# Patient Record
Sex: Female | Born: 1950
Health system: Southern US, Community
[De-identification: ages and names within clinical notes are randomized; demographics above are authoritative.]

## PROBLEM LIST (undated history)

## (undated) DIAGNOSIS — K219 Gastro-esophageal reflux disease without esophagitis: Secondary | ICD-10-CM

## (undated) DIAGNOSIS — M1990 Unspecified osteoarthritis, unspecified site: Secondary | ICD-10-CM

## (undated) DIAGNOSIS — K5730 Diverticulosis of large intestine without perforation or abscess without bleeding: Secondary | ICD-10-CM

## (undated) DIAGNOSIS — Z8709 Personal history of other diseases of the respiratory system: Secondary | ICD-10-CM

## (undated) DIAGNOSIS — N189 Chronic kidney disease, unspecified: Secondary | ICD-10-CM

## (undated) DIAGNOSIS — M6530 Trigger finger, unspecified finger: Secondary | ICD-10-CM

## (undated) DIAGNOSIS — I10 Essential (primary) hypertension: Secondary | ICD-10-CM

## (undated) DIAGNOSIS — M810 Age-related osteoporosis without current pathological fracture: Secondary | ICD-10-CM

## (undated) DIAGNOSIS — F419 Anxiety disorder, unspecified: Secondary | ICD-10-CM

## (undated) DIAGNOSIS — E7800 Pure hypercholesterolemia, unspecified: Secondary | ICD-10-CM

## (undated) DIAGNOSIS — R718 Other abnormality of red blood cells: Secondary | ICD-10-CM

## (undated) DIAGNOSIS — I872 Venous insufficiency (chronic) (peripheral): Secondary | ICD-10-CM

## (undated) HISTORY — PX: POLYPECTOMY: SHX149

## (undated) HISTORY — DX: Venous insufficiency (chronic) (peripheral): I87.2

## (undated) HISTORY — DX: Other abnormality of red blood cells: R71.8

## (undated) HISTORY — PX: MULTIPLE TOOTH EXTRACTIONS: SHX2053

## (undated) HISTORY — PX: DENTAL SURGERY: SHX609

## (undated) HISTORY — PX: COLONOSCOPY: SHX174

## (undated) HISTORY — DX: Anxiety disorder, unspecified: F41.9

## (undated) HISTORY — DX: Chronic kidney disease, unspecified: N18.9

## (undated) HISTORY — DX: Personal history of other diseases of the respiratory system: Z87.09

## (undated) HISTORY — DX: Pure hypercholesterolemia, unspecified: E78.00

## (undated) HISTORY — DX: Unspecified osteoarthritis, unspecified site: M19.90

## (undated) HISTORY — DX: Age-related osteoporosis without current pathological fracture: M81.0

## (undated) HISTORY — PX: TUBAL LIGATION: SHX77

## (undated) HISTORY — DX: Diverticulosis of large intestine without perforation or abscess without bleeding: K57.30

## (undated) HISTORY — DX: Essential (primary) hypertension: I10

## (undated) HISTORY — DX: Gastro-esophageal reflux disease without esophagitis: K21.9

---

## 1989-09-25 HISTORY — PX: FOOT SURGERY: SHX648

## 1998-01-07 ENCOUNTER — Ambulatory Visit (HOSPITAL_COMMUNITY): Admission: RE | Admit: 1998-01-07 | Discharge: 1998-01-07 | Payer: Self-pay | Admitting: Pulmonary Disease

## 1998-07-13 ENCOUNTER — Ambulatory Visit (HOSPITAL_COMMUNITY): Admission: RE | Admit: 1998-07-13 | Discharge: 1998-07-13 | Payer: Self-pay | Admitting: Pulmonary Disease

## 1999-07-21 ENCOUNTER — Encounter: Payer: Self-pay | Admitting: Pulmonary Disease

## 1999-07-21 ENCOUNTER — Ambulatory Visit (HOSPITAL_COMMUNITY): Admission: RE | Admit: 1999-07-21 | Discharge: 1999-07-21 | Payer: Self-pay | Admitting: Pulmonary Disease

## 1999-12-28 ENCOUNTER — Other Ambulatory Visit: Admission: RE | Admit: 1999-12-28 | Discharge: 1999-12-28 | Payer: Self-pay | Admitting: *Deleted

## 2000-07-19 ENCOUNTER — Ambulatory Visit (HOSPITAL_COMMUNITY): Admission: RE | Admit: 2000-07-19 | Discharge: 2000-07-19 | Payer: Self-pay | Admitting: *Deleted

## 2000-07-19 ENCOUNTER — Encounter: Payer: Self-pay | Admitting: *Deleted

## 2001-04-30 ENCOUNTER — Other Ambulatory Visit: Admission: RE | Admit: 2001-04-30 | Discharge: 2001-04-30 | Payer: Self-pay | Admitting: *Deleted

## 2001-08-05 ENCOUNTER — Encounter: Payer: Self-pay | Admitting: *Deleted

## 2001-08-05 ENCOUNTER — Ambulatory Visit (HOSPITAL_COMMUNITY): Admission: RE | Admit: 2001-08-05 | Discharge: 2001-08-05 | Payer: Self-pay | Admitting: *Deleted

## 2002-08-14 ENCOUNTER — Encounter: Payer: Self-pay | Admitting: *Deleted

## 2002-08-14 ENCOUNTER — Ambulatory Visit (HOSPITAL_COMMUNITY): Admission: RE | Admit: 2002-08-14 | Discharge: 2002-08-14 | Payer: Self-pay | Admitting: *Deleted

## 2003-03-09 ENCOUNTER — Other Ambulatory Visit: Admission: RE | Admit: 2003-03-09 | Discharge: 2003-03-09 | Payer: Self-pay | Admitting: *Deleted

## 2003-09-01 ENCOUNTER — Ambulatory Visit (HOSPITAL_COMMUNITY): Admission: RE | Admit: 2003-09-01 | Discharge: 2003-09-01 | Payer: Self-pay | Admitting: *Deleted

## 2004-03-22 ENCOUNTER — Other Ambulatory Visit: Admission: RE | Admit: 2004-03-22 | Discharge: 2004-03-22 | Payer: Self-pay | Admitting: *Deleted

## 2004-09-21 ENCOUNTER — Ambulatory Visit (HOSPITAL_COMMUNITY): Admission: RE | Admit: 2004-09-21 | Discharge: 2004-09-21 | Payer: Self-pay | Admitting: *Deleted

## 2005-04-25 ENCOUNTER — Ambulatory Visit: Payer: Self-pay | Admitting: Pulmonary Disease

## 2005-04-26 ENCOUNTER — Other Ambulatory Visit: Admission: RE | Admit: 2005-04-26 | Discharge: 2005-04-26 | Payer: Self-pay | Admitting: *Deleted

## 2005-05-10 ENCOUNTER — Ambulatory Visit: Payer: Self-pay | Admitting: Pulmonary Disease

## 2005-10-12 ENCOUNTER — Ambulatory Visit (HOSPITAL_COMMUNITY): Admission: RE | Admit: 2005-10-12 | Discharge: 2005-10-12 | Payer: Self-pay | Admitting: *Deleted

## 2006-05-14 ENCOUNTER — Other Ambulatory Visit: Admission: RE | Admit: 2006-05-14 | Discharge: 2006-05-14 | Payer: Self-pay | Admitting: *Deleted

## 2006-06-19 ENCOUNTER — Ambulatory Visit: Payer: Self-pay | Admitting: Pulmonary Disease

## 2006-06-26 ENCOUNTER — Ambulatory Visit: Payer: Self-pay | Admitting: Pulmonary Disease

## 2006-07-11 ENCOUNTER — Ambulatory Visit: Payer: Self-pay | Admitting: Internal Medicine

## 2006-07-31 ENCOUNTER — Ambulatory Visit: Payer: Self-pay | Admitting: Internal Medicine

## 2006-11-14 ENCOUNTER — Ambulatory Visit (HOSPITAL_COMMUNITY): Admission: RE | Admit: 2006-11-14 | Discharge: 2006-11-14 | Payer: Self-pay | Admitting: Neurology

## 2007-08-08 ENCOUNTER — Ambulatory Visit: Payer: Self-pay | Admitting: Pulmonary Disease

## 2007-08-20 ENCOUNTER — Telehealth (INDEPENDENT_AMBULATORY_CARE_PROVIDER_SITE_OTHER): Payer: Self-pay | Admitting: *Deleted

## 2007-08-21 ENCOUNTER — Ambulatory Visit: Payer: Self-pay | Admitting: Pulmonary Disease

## 2007-08-21 LAB — CONVERTED CEMR LAB
BUN: 17 mg/dL (ref 6–23)
Basophils Relative: 0.7 % (ref 0.0–1.0)
Bilirubin, Direct: 0.1 mg/dL (ref 0.0–0.3)
Calcium: 9.6 mg/dL (ref 8.4–10.5)
Chloride: 104 meq/L (ref 96–112)
Creatinine, Ser: 0.7 mg/dL (ref 0.4–1.2)
Eosinophils Absolute: 0 10*3/uL (ref 0.0–0.6)
Eosinophils Relative: 1.4 % (ref 0.0–5.0)
Glucose, Bld: 85 mg/dL (ref 70–99)
HDL: 49.1 mg/dL (ref >39.0)
Hemoglobin: 13.4 g/dL (ref 12.0–15.0)
LDL Cholesterol: 84 mg/dL (ref 0–99)
MCV: 81.3 fL (ref 78.0–100.0)
Monocytes Relative: 11.1 % — ABNORMAL HIGH (ref 3.0–11.0)
Neutro Abs: 1.4 10*3/uL (ref 1.4–7.7)
Neutrophils Relative %: 48.7 % (ref 43.0–77.0)
RDW: 12.7 % (ref 11.5–14.6)
TSH: 0.81 microintl units/mL (ref 0.35–5.50)
VLDL: 6 mg/dL (ref 0–40)

## 2007-08-26 ENCOUNTER — Encounter: Payer: Self-pay | Admitting: Pulmonary Disease

## 2007-08-26 DIAGNOSIS — I10 Essential (primary) hypertension: Secondary | ICD-10-CM

## 2007-08-26 DIAGNOSIS — E7800 Pure hypercholesterolemia, unspecified: Secondary | ICD-10-CM

## 2007-08-26 DIAGNOSIS — F411 Generalized anxiety disorder: Secondary | ICD-10-CM | POA: Insufficient documentation

## 2007-11-18 ENCOUNTER — Ambulatory Visit (HOSPITAL_COMMUNITY): Admission: RE | Admit: 2007-11-18 | Discharge: 2007-11-18 | Payer: Self-pay | Admitting: *Deleted

## 2008-08-28 ENCOUNTER — Ambulatory Visit: Payer: Self-pay | Admitting: Pulmonary Disease

## 2008-08-28 ENCOUNTER — Telehealth (INDEPENDENT_AMBULATORY_CARE_PROVIDER_SITE_OTHER): Payer: Self-pay | Admitting: *Deleted

## 2008-08-28 DIAGNOSIS — R718 Other abnormality of red blood cells: Secondary | ICD-10-CM | POA: Insufficient documentation

## 2008-08-28 DIAGNOSIS — K5730 Diverticulosis of large intestine without perforation or abscess without bleeding: Secondary | ICD-10-CM | POA: Insufficient documentation

## 2008-08-28 DIAGNOSIS — I872 Venous insufficiency (chronic) (peripheral): Secondary | ICD-10-CM

## 2008-08-28 DIAGNOSIS — J40 Bronchitis, not specified as acute or chronic: Secondary | ICD-10-CM | POA: Insufficient documentation

## 2008-09-04 ENCOUNTER — Ambulatory Visit: Payer: Self-pay | Admitting: Pulmonary Disease

## 2008-09-04 DIAGNOSIS — E039 Hypothyroidism, unspecified: Secondary | ICD-10-CM | POA: Insufficient documentation

## 2008-09-04 DIAGNOSIS — R748 Abnormal levels of other serum enzymes: Secondary | ICD-10-CM | POA: Insufficient documentation

## 2008-09-04 DIAGNOSIS — D649 Anemia, unspecified: Secondary | ICD-10-CM | POA: Insufficient documentation

## 2008-09-06 LAB — CONVERTED CEMR LAB
Albumin: 3.7 g/dL (ref 3.5–5.2)
Alkaline Phosphatase: 103 units/L (ref 39–117)
BUN: 17 mg/dL (ref 6–23)
Basophils Absolute: 0 10*3/uL (ref 0.0–0.1)
Chloride: 104 meq/L (ref 96–112)
Cholesterol: 187 mg/dL (ref 0–200)
GFR calc non Af Amer: 92 mL/min
Glucose, Bld: 89 mg/dL (ref 70–99)
HDL: 49.3 mg/dL (ref >39.0)
Hemoglobin: 13 g/dL (ref 12.0–15.0)
MCHC: 34 g/dL (ref 30.0–36.0)
MCV: 81.2 fL (ref 78.0–100.0)
Monocytes Absolute: 0.4 10*3/uL (ref 0.1–1.0)
Neutro Abs: 1.9 10*3/uL (ref 1.4–7.7)
Neutrophils Relative %: 56.1 % (ref 43.0–77.0)
Platelets: 155 10*3/uL (ref 150–400)
Potassium: 4.3 meq/L (ref 3.5–5.1)
RBC: 4.69 M/uL (ref 3.87–5.11)
Sodium: 141 meq/L (ref 135–145)
TSH: 0.54 microintl units/mL (ref 0.35–5.50)
VLDL: 8 mg/dL (ref 0–40)
WBC: 3.3 10*3/uL — ABNORMAL LOW (ref 4.5–10.5)

## 2008-11-20 ENCOUNTER — Ambulatory Visit (HOSPITAL_COMMUNITY): Admission: RE | Admit: 2008-11-20 | Discharge: 2008-11-20 | Payer: Self-pay | Admitting: Pulmonary Disease

## 2009-11-08 ENCOUNTER — Ambulatory Visit: Payer: Self-pay | Admitting: Pulmonary Disease

## 2009-11-09 ENCOUNTER — Ambulatory Visit: Payer: Self-pay | Admitting: Pulmonary Disease

## 2009-11-11 LAB — CONVERTED CEMR LAB
AST: 26 units/L (ref 0–37)
Albumin: 4.1 g/dL (ref 3.5–5.2)
Basophils Absolute: 0 10*3/uL (ref 0.0–0.1)
Basophils Relative: 0.4 % (ref 0.0–3.0)
Bilirubin Urine: NEGATIVE
CO2: 30 meq/L (ref 19–32)
Calcium: 9.7 mg/dL (ref 8.4–10.5)
Creatinine, Ser: 0.7 mg/dL (ref 0.4–1.2)
GFR calc non Af Amer: 110.62 mL/min (ref >60)
Glucose, Bld: 83 mg/dL (ref 70–99)
HCT: 39.5 % (ref 36.0–46.0)
Hemoglobin, Urine: NEGATIVE
Hemoglobin: 12.7 g/dL (ref 12.0–15.0)
Iron: 68 ug/dL (ref 42–145)
Leukocytes, UA: NEGATIVE
Lymphocytes Relative: 28.4 % (ref 12.0–46.0)
MCHC: 32.3 g/dL (ref 30.0–36.0)
MCV: 82.6 fL (ref 78.0–100.0)
Neutro Abs: 2.1 10*3/uL (ref 1.4–7.7)
Platelets: 172 10*3/uL (ref 150.0–400.0)
Potassium: 4.3 meq/L (ref 3.5–5.1)
Sodium: 140 meq/L (ref 135–145)
Specific Gravity, Urine: 1.01 (ref 1.000–1.030)
Total Bilirubin: 0.7 mg/dL (ref 0.3–1.2)
Total Protein, Urine: NEGATIVE mg/dL
Transferrin: 254.5 mg/dL (ref 212.0–360.0)
VLDL: 9.2 mg/dL (ref 0.0–40.0)
WBC: 3.7 10*3/uL — ABNORMAL LOW (ref 4.5–10.5)

## 2009-11-24 ENCOUNTER — Ambulatory Visit (HOSPITAL_COMMUNITY): Admission: RE | Admit: 2009-11-24 | Discharge: 2009-11-24 | Payer: Self-pay | Admitting: Pulmonary Disease

## 2010-07-12 ENCOUNTER — Ambulatory Visit: Payer: Self-pay | Admitting: Pulmonary Disease

## 2010-07-12 DIAGNOSIS — J069 Acute upper respiratory infection, unspecified: Secondary | ICD-10-CM | POA: Insufficient documentation

## 2010-10-25 NOTE — Assessment & Plan Note (Signed)
Summary: SOB/ COUGHING /CONGESTED/ MBW   Visit Type:  Acute NP visit Primary Provider/Referring Provider:  Alroy Dust, MD  CC:  Pt c/o  productive cough at night with small amount of thick light yellow mucus, non-productive cough during the day, head and chest congestion, sneezing, increased SOB during the day with exertion starting Friday. sore throat x 3 days now gone. Pt denies headaches, fever, chills, and n/v/d.  History of Present Illness: 60 yo female with known hx of HTN and Hyperlipidemia.    July 12, 2010--Presents for an acute office visit. Complains of  productive cough at night with small amount of thick light yellow mucus, non-productive cough during the day, head and chest congestion, sneezing,  increased SOB during the day with exertion starting Friday. sore throat x 3 days now gone. Pt denies headaches, fever, chills, n/v/d. Denies chest pain, dyspnea, orthopnea, hemoptysis, fever, n/v/d, edema, headache,recent travel or antibiotics  Preventive Screening-Counseling & Management  Alcohol-Tobacco     Smoking Status: never  Current Medications (verified): 1)  Amlodipine Besy-Benazepril Hcl 10-20 Mg Caps (Amlodipine Besy-Benazepril Hcl) .... Take 1 Tablet By Mouth Once A Day 2)  Triamterene-Hctz 37.5-25 Mg Tabs (Triamterene-Hctz) .... Take 1 Tablet By Mouth Once A Day 3)  Simvastatin 20 Mg Tabs (Simvastatin) .... Take 1 Tablet By Mouth Once A Day 4)  Tylenol Nighttime Cold & Flu 60-12.02-21-999 Mg/5ml Liqd (Pseudoeph-Doxylamine-Dm-Apap) .... As Directed Per Bottle  Allergies (verified): No Known Drug Allergies  Past History:  Past Medical History: Last updated: 11/08/2009  Hx of BRONCHITIS (ICD-490) HYPERTENSION (ICD-401.9) VENOUS INSUFFICIENCY (ICD-459.81) HYPERCHOLESTEROLEMIA (ICD-272.0) DIVERTICULOSIS OF COLON (ICD-562.10) ANXIETY (ICD-300.00) Hx of MICROCYTOSIS (ICD-790.09)  Past Surgical History: Last updated: 11/08/2009 S/P foot surgery in  1991  Family History: Last updated: 09-14-08 Father died ?age ?DM- didn't know him... Mother alive age 77 w/ HBP and arthritis... 4 Siblings:  1 Sis w/ HBP & arthritis; 3 sibs- ?don't know them...  Social History: Last updated: 14-Sep-2008 Divorced 2 Children Never smoked Social Etoh Not exercising  Risk Factors: Smoking Status: never (07/12/2010)  Review of Systems      See HPI  Vital Signs:  Patient profile:   60 year old female Height:      63.5 inches Weight:      165.8 pounds BMI:     29.01 BP sitting:   138 / 90  (left arm) Cuff size:   regular  Vitals Entered By: Zackery Barefoot CMA (July 12, 2010 2:53 PM)  O2 Flow:  Room air CC: Pt c/o  productive cough at night with small amount of thick light yellow mucus, non-productive cough during the day, head and chest congestion, sneezing,  increased SOB during the day with exertion starting Friday. sore throat x 3 days now gone. Pt denies headaches, fever, chills, n/v/d Comments Medications reviewed with patient Verified contact number and pharmacy with patient Zackery Barefoot CMA  July 12, 2010 2:54 PM    Physical Exam  Additional Exam:  GEN: A/Ox3; pleasant , NAD HEENT:  [place]/AT, , EACs-clear, TMs-wnl, NOSE-clear drainage , THROAT-clear NECK:  Supple w/ fair ROM; no JVD; normal carotid impulses w/o bruits; no thyromegaly or nodules palpated; no lymphadenopathy. RESP  Clear to P & A; w/o, wheezes/ rales/ or rhonchi. CARD:  RRR, no m/r/g   GI:   Soft & nt; nml bowel sounds; no organomegaly or masses detected. [place]co: Warm bil,  no calf tenderness edema, clubbing, pulses intact Neuro:intact w/ no focal deficits noted.    Impression &  Recommendations:  Problem # 1:  UPPER RESPIRATORY INFECTION, ACUTE (ICD-465.9)  Mucinex DM two times a day as needed cough/congestion Saline nasal rinses as needed  Zyrtec 10mg  at bedtime as needed drainage.  Increased fluid and rest  Tylenol as needed  Hydromet 1-2 tsp  every 4-6 hr as needed cough , may make you sleepy.  Zpack to have on hold if symptoms worsen with discolored mucus.  Please contact office for sooner follow up if symptoms do not improve or worsen The following medications were removed from the medication list:    Aspirin 81 Mg Tbec (Aspirin) .Marland Kitchen... Take 1 tab by mouth once daily... Her updated medication list for this problem includes:    Tylenol Nighttime Cold & Flu 60-12.02-21-999 Mg/58ml Liqd (Pseudoeph-doxylamine-dm-apap) .Marland Kitchen... As directed per bottle    Hydromet 5-1.5 Mg/92ml Syrp (Hydrocodone-homatropine) .Marland Kitchen... 1-2 tsp every 4-6 hr as needed cough  Orders: Est. Patient Level III (25956)  Medications Added to Medication List This Visit: 1)  Tylenol Nighttime Cold & Flu 60-12.02-21-999 Mg/75ml Liqd (Pseudoeph-doxylamine-dm-apap) .... As directed per bottle 2)  Hydromet 5-1.5 Mg/43ml Syrp (Hydrocodone-homatropine) .Marland Kitchen.. 1-2 tsp every 4-6 hr as needed cough 3)  Zithromax Tri-pak 500 Mg Tabs (Azithromycin) .... Take as directed.  Complete Medication List: 1)  Amlodipine Besy-benazepril Hcl 10-20 Mg Caps (Amlodipine besy-benazepril hcl) .... Take 1 tablet by mouth once a day 2)  Triamterene-hctz 37.5-25 Mg Tabs (Triamterene-hctz) .... Take 1 tablet by mouth once a day 3)  Simvastatin 20 Mg Tabs (Simvastatin) .... Take 1 tablet by mouth once a day 4)  Tylenol Nighttime Cold & Flu 60-12.02-21-999 Mg/23ml Liqd (Pseudoeph-doxylamine-dm-apap) .... As directed per bottle 5)  Hydromet 5-1.5 Mg/19ml Syrp (Hydrocodone-homatropine) .Marland Kitchen.. 1-2 tsp every 4-6 hr as needed cough 6)  Zithromax Tri-pak 500 Mg Tabs (Azithromycin) .... Take as directed.  Patient Instructions: 1)  Mucinex DM two times a day as needed cough/congestion 2)  Saline nasal rinses as needed  3)  Zyrtec 10mg  at bedtime as needed drainage.  4)  Increased fluid and rest  5)  Tylenol as needed  6)  Hydromet 1-2 tsp every 4-6 hr as needed cough , may make you sleepy.  7)  Zpack to have  on hold if symptoms worsen with discolored mucus.  8)  Please contact office for sooner follow up if symptoms do not improve or worsen Prescriptions: ZITHROMAX TRI-PAK 500 MG TABS (AZITHROMYCIN) take as directed.  #1 x 0   Entered and Authorized by:   Rubye Oaks NP   Signed by:   Ravinder Lukehart NP on 07/12/2010   Method used:   Print then Give to Patient   RxID:   [id number] HYDROMET 5-1.5 MG/5ML SYRP (HYDROCODONE-HOMATROPINE) 1-2 tsp every 4-6 hr as needed cough  #8 oz x 0   Entered and Authorized by:   Rubye Oaks NP   Signed by:   Oskar Cretella NP on 07/12/2010   Method used:   Print then Give to Patient   RxID:   [id number]

## 2010-10-25 NOTE — Assessment & Plan Note (Signed)
Summary: physical ///kp   CC:  14 month ROV & CPX....  History of Present Illness: 60 y/o BF here for a follow up visit... she has had a good year and has no new complaints or concerns today... she hasn't been dieting or trying to exercise, but she promises to start...     Current Problems:   Hx of BRONCHITIS (ICD-490) - she is a never smoker... hx bronchitic infections in the past- no recent symptoms... ??remote hx of Sarcoidosis in the 1970's- ?details, and no residual problems...  ~  baseline CXR clear, NAD.Marland Kitchen.  ~  CXR 2/11 showed min tort Ao, clear lungs, WNL...  HYPERTENSION (ICD-401.9) - on LOTREL 10-20 daily + MAXZIDE-25 daily... BP = 130/88 today, feels well, checks BP occas at school- "all good"...  denies HA, fatigue, visual changes, CP, palipit, dizziness, syncope, dyspnea, edema, etc...   VENOUS INSUFFICIENCY (ICD-459.81) - she follows a low sodium diet, elevates legs, & wears support hose Prn...  HYPERCHOLESTEROLEMIA (ICD-272.0) - on SIMVASTATIN 20mg /d w/ good response...  ~  prev FLP's showed TChol 193-223, TG 46-68, HDL 48-64, LDL 126-152... all these on diet alone.  ~  FLP 10/07 on diet alone showed TChol 203, TG 42, HDL 55, LDL 132... rec- start Simva20.  ~  FLP 11/08 on Simva20 showed TChol 139, TG 31, HDL 49, LDL 84... rec- continue same.  ~  FLP 12/09 ?on Simva20 showed TChol 187, TG 40, HDL 49, LDL 130... rec> take Simva20 daily.  ~  FLP 2/11 on Simva20 showed TChol 148, TG 46, HDL 61, LDL 77... rec> keep same.  DIVERTICULOSIS OF COLON (ICD-562.10) - colonoscopy 11/07 by DrPerry showed divertics only... f/u planned 10 yrs...  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  Hx of MICROCYTOSIS (ICD-790.09) - her CBC's x yrs have shown normal Hg, but small cells w/ MCV in the 79-80 range--- ? prev iron studies? she is post menopausal, all stool checks have been neg...  ~  labs 12/09 showed Hg= 13.0, MCV= 81  ~  labs 2/11 showed Hg= 12.7,  MCV= 83, Fe= 68   Comments:  Nurse/Medical Assistant: The patient's medications and allergies were reviewed with the patient and were updated in the Medication and Allergy Lists.  Past History:  Past Medical History:  Hx of BRONCHITIS (ICD-490) HYPERTENSION (ICD-401.9) VENOUS INSUFFICIENCY (ICD-459.81) HYPERCHOLESTEROLEMIA (ICD-272.0) DIVERTICULOSIS OF COLON (ICD-562.10) ANXIETY (ICD-300.00) Hx of MICROCYTOSIS (ICD-790.09)  Past Surgical History: S/P foot surgery in 1991  Family History: Reviewed history from 08/28/2008 and no changes required. Father died ?age ?DM- didn't know him... Mother alive age 43 w/ HBP and arthritis... 4 Siblings:  1 Sis w/ HBP & arthritis; 3 sibs- ?don't know them...  Social History: Reviewed history from 08/28/2008 and no changes required. Divorced 2 Children Never smoked Social Etoh Not exercising  Review of Systems       The patient complains of gas/bloating and stiffness.  The patient denies fever, chills, sweats, anorexia, fatigue, weakness, malaise, weight loss, sleep disorder, blurring, diplopia, eye irritation, eye discharge, vision loss, eye pain, photophobia, earache, ear discharge, tinnitus, decreased hearing, nasal congestion, nosebleeds, sore throat, hoarseness, chest pain, palpitations, syncope, dyspnea on exertion, orthopnea, PND, peripheral edema, cough, dyspnea at rest, excessive sputum, hemoptysis, wheezing, pleurisy, nausea, vomiting, diarrhea, constipation, change in bowel habits, abdominal pain, melena, hematochezia, jaundice, indigestion/heartburn, dysphagia, odynophagia, dysuria, hematuria, urinary frequency, urinary hesitancy, nocturia, incontinence, back pain, joint pain, joint swelling, muscle cramps, muscle weakness, arthritis, sciatica, restless legs, leg pain  at night, leg pain with exertion, rash, itching, dryness, suspicious lesions, paralysis, paresthesias, seizures, tremors, vertigo, transient blindness, frequent  falls, frequent headaches, difficulty walking, depression, anxiety, memory loss, confusion, cold intolerance, heat intolerance, polydipsia, polyphagia, polyuria, unusual weight change, abnormal bruising, bleeding, enlarged lymph nodes, urticaria, allergic rash, hay fever, and recurrent infections.    Vital Signs:  Patient profile:   60 year old female Height:      63.5 inches Weight:      170.38 pounds BMI:     29.82 O2 Sat:      99 % on Room air Temp:     99.0 degrees F oral Pulse rate:   58 / minute BP sitting:   130 / 88  (right arm) Cuff size:   regular  Vitals Entered By: Amy Haynes CMA (November 08, 2009 11:09 AM)  O2 Sat at Rest %:  99 O2 Flow:  Room air CC: 14 month ROV & CPX... Is Patient Diabetic? No Pain Assessment Patient in pain? no      Comments NO CHANGES IN MEDS   Physical Exam  Additional Exam:  WD, WN, 60 y/o BF in NAD... GENERAL:  Alert & oriented; pleasant & cooperative... HEENT:  Amy Haynes/AT, EOM-wnl, PERRLA, EACs-clear, TMs-wnl, NOSE-clear, THROAT-clear & wnl. NECK:  Supple w/ full ROM; no JVD; normal carotid impulses w/o bruits; no thyromegaly or nodules palpated; no lymphadenopathy. CHEST:  Clear to P & A; without wheezes/ rales/ or rhonchi. HEART:  Regular Rhythm; without murmurs/ rubs/ or gallops. ABDOMEN:  Soft & nontender; normal bowel sounds; no organomegaly or masses detected. EXT: without deformities, mild arthritic changes; no varicose veins/ +venous insuffic/ no edema. NEURO:  CN's intact; motor testing normal; sensory testing normal; gait normal & balance OK. DERM:  No lesions noted; no rash etc...    CXR  Procedure date:  11/08/2009  Findings:      CHEST - 2 VIEW Comparison: None.   Findings: Midline trachea.  Normal heart size.  Mildly tortuous thoracic aorta. No pleural effusion or pneumothorax.  Clear lungs.   IMPRESSION: No acute cardiopulmonary disease.   Read By:  Amy Haynes,  M.D.   EKG  Procedure date:   11/08/2009  Findings:      Normal sinus rhythm with rate of:  60/min Tracing is WNL, NAD...  SN   MISC. Report  Procedure date:  11/09/2009  Findings:      Lipid Panel (LIPID)   Cholesterol               148 mg/dL                   1-610   Triglycerides             46.0 mg/dL                  9.6-045.4   HDL                       09.81 mg/dL                 >19.14   LDL Cholesterol           77 mg/dL                    7-82  BMP (METABOL)   Sodium                    140 mEq/L  135-145   Potassium                 4.3 mEq/L                   3.5-5.1   Chloride                  104 mEq/L                   96-112   Carbon Dioxide            30 mEq/L                    19-32   Glucose                   83 mg/dL                    16-10   BUN                       13 mg/dL                    9-60   Creatinine                0.7 mg/dL                   4.5-4.0   Calcium                   9.7 mg/dL                   9.8-11.9   GFR                       110.62 mL/min               >60  Hepatic/Liver Function Panel (HEPATIC)   Total Bilirubin           0.7 mg/dL                   1.4-7.8   Direct Bilirubin          0.2 mg/dL                   2.9-5.6   Alkaline Phosphatase      78 U/L                      39-117   AST                       26 U/L                      0-37   ALT                       31 U/L                      0-35   Total Protein             7.8 g/dL                    2.1-3.0   Albumin                   4.1 g/dL  3.5-5.2  Comments:      CBC Platelet w/Diff (CBCD)   White Cell Count     [L]  3.7 K/uL                    4.5-10.5   Red Cell Count            4.78 Mil/uL                 3.87-5.11   Hemoglobin                12.7 g/dL                   53.6-64.4   Hematocrit                39.5 %                      36.0-46.0   MCV                       82.6 fl                     78.0-100.0   Platelet Count            172.0 K/uL                   150.0-400.0   Neutrophil %              57.0 %                      43.0-77.0   Lymphocyte %              28.4 %                      12.0-46.0   Monocyte %           [H]  13.5 %                      3.0-12.0   Eosinophils%              0.7 %                       0.0-5.0   Basophils %               0.4 %                       0.0-3.0  Iron (FE)   Iron                      68 ug/dL                    03-474  Transferrin (TRNSF)   Transferrin               254.5 mg/dL                 259.5-638.7  TSH (TSH)   FastTSH                   1.40 uIU/mL                 0.35-5.50  UDip Only (UDIP)   Color  Yellow   Clarity                   CLEAR                       Clear   Specific Gravity          1.010                       1.000 - 1.030   Urine Ph                  6.5                         5.0-8.0   Protein                   NEGATIVE                    Negative   Urine Glucose             NEGATIVE                    Negative   Ketones                   NEGATIVE                    Negative   Urine Bilirubin           NEGATIVE                    Negative   Blood                     NEGATIVE                    Negative  Urobilinogen              0.2                         0.0 - 1.0   Leukocyte Esterace        NEGATIVE                    Negative   Nitrite                   NEGATIVE                    Negative   Impression & Recommendations:  Problem # 1:  PHYSICAL EXAMINATION (ICD-V70.0) She is doing well overall... Orders: EKG w/ Interpretation (93000) T-2 View CXR (71020TC)  Problem # 2:  HYPERTENSION (ICD-401.9) BP controlled on meds... continue same... Her updated medication list for this problem includes:    Amlodipine Besy-benazepril Hcl 10-20 Mg Caps (Amlodipine besy-benazepril hcl) .Marland Kitchen... Take 1 tablet by mouth once a day    Triamterene-hctz 37.5-25 Mg Tabs (Triamterene-hctz) .Marland Kitchen... Take 1 tablet by mouth once a day  Problem # 3:  VENOUS  INSUFFICIENCY (ICD-459.81) Stable-  no edema, reminded no salt!!!  Problem # 4:  HYPERCHOLESTEROLEMIA (ICD-272.0) She appears to be tol the simva20 well... f/u FLP on Rx looks reasonable... advised better diet + exercise!!! Her updated medication list for this problem includes:    Simvastatin 20 Mg Tabs (Simvastatin) .Marland Kitchen... Take 1 tablet by mouth once  a day  Problem # 5:  DIVERTICULOSIS OF COLON (ICD-562.10) She is up to date on colon etc...  Problem # 6:  OTHER MEDICAL PROBLEMS AS NOTED>>> Hg & Fe are OK...  Complete Medication List: 1)  Aspirin 81 Mg Tbec (Aspirin) .... Take 1 tab by mouth once daily.Marland KitchenMarland Kitchen 2)  Amlodipine Besy-benazepril Hcl 10-20 Mg Caps (Amlodipine besy-benazepril hcl) .... Take 1 tablet by mouth once a day 3)  Triamterene-hctz 37.5-25 Mg Tabs (Triamterene-hctz) .... Take 1 tablet by mouth once a day 4)  Simvastatin 20 Mg Tabs (Simvastatin) .... Take 1 tablet by mouth once a day 5)  Caltrate 600+d Plus 600-400 Mg-unit Tabs (Calcium carbonate-vit d-min) .... Take 1 tab by mouth two times a day... 6)  Multiple Vitamin Tabs (Multiple vitamin) .... Take 1 tab by mouth once daily...  Other Orders: Prescription Created Electronically [phone])  Patient Instructions: 1)  Today we updated your med list- see below.... 2)  We refilled your meds for 2011... 3)  Today we did your f/u CXR & EKG... 4)  Please return to our lab in the AM for your FASTING blood work... then call the "phone tree" in a few days for your lab results.Marland KitchenMarland Kitchen 5)  Call for any problems.Marland KitchenMarland Kitchen 6)  Please schedule a follow-up appointment in 1 year. Prescriptions: SIMVASTATIN 20 MG TABS (SIMVASTATIN) Take 1 tablet by mouth once a day  #30 x prn   Entered and Authorized by:   Michele Mcalpine MD   Signed by:   Michele Mcalpine MD on 11/08/2009   Method used:   Print then Give to Patient   RxID:   [id number] TRIAMTERENE-HCTZ 37.5-25 MG TABS (TRIAMTERENE-HCTZ) Take 1 tablet by mouth once a day  #30 x prn   Entered  and Authorized by:   Michele Mcalpine MD   Signed by:   Michele Mcalpine MD on 11/08/2009   Method used:   Print then Give to Patient   RxID:   [id number] AMLODIPINE BESY-BENAZEPRIL HCL 10-20 MG CAPS (AMLODIPINE BESY-BENAZEPRIL HCL) Take 1 tablet by mouth once a day  #30 x prn   Entered and Authorized by:   Michele Mcalpine MD   Signed by:   Michele Mcalpine MD on 11/08/2009   Method used:   Print then Give to Patient   RxID:   [id number]    [facility] ECG  ID: [id number] Patient: Amy Haynes DOB: [birth year] Age: 60 Years Old Sex: Female Race: Black Physician: scott nadel Technician: Amy Haynes CMA Height: 63.5 Weight: 170.38 Status: Unconfirmed Past Medical History:   Hx of BRONCHITIS (ICD-490) HYPERTENSION (ICD-401.9) VENOUS INSUFFICIENCY (ICD-459.81) HYPERCHOLESTEROLEMIA (ICD-272.0) DIVERTICULOSIS OF COLON (ICD-562.10) ANXIETY (ICD-300.00) Hx of MICROCYTOSIS (ICD-790.09)   Recorded: 11/08/2009 11:29 AM P/PR: 133 ms / 197 ms - Heart rate (maximum exercise) QRS: 96 QT/QTc/QTd: 435 ms / 433 ms / 53 ms - Heart rate (maximum exercise)  P/QRS/T axis: 74 deg / 87 deg / 42 deg - Heart rate (maximum exercise)  Heartrate: 59 bpm  Interpretation:  Normal sinus rhythm with rate of:  60/min Tracing is WNL, NAD...  SN

## 2010-11-25 ENCOUNTER — Other Ambulatory Visit: Payer: Self-pay | Admitting: Pulmonary Disease

## 2010-11-25 DIAGNOSIS — Z1231 Encounter for screening mammogram for malignant neoplasm of breast: Secondary | ICD-10-CM

## 2010-11-29 ENCOUNTER — Encounter: Payer: Self-pay | Admitting: Pulmonary Disease

## 2010-11-29 ENCOUNTER — Other Ambulatory Visit: Payer: Self-pay | Admitting: Pulmonary Disease

## 2010-11-29 ENCOUNTER — Ambulatory Visit (INDEPENDENT_AMBULATORY_CARE_PROVIDER_SITE_OTHER): Payer: BC Managed Care – PPO | Admitting: Pulmonary Disease

## 2010-11-29 ENCOUNTER — Ambulatory Visit (INDEPENDENT_AMBULATORY_CARE_PROVIDER_SITE_OTHER)
Admission: RE | Admit: 2010-11-29 | Discharge: 2010-11-29 | Disposition: A | Discharge status: Home / Self Care | Payer: BC Managed Care – PPO | Source: Ambulatory Visit | Attending: Pulmonary Disease | Admitting: Pulmonary Disease

## 2010-11-29 DIAGNOSIS — Z0000 Encounter for general adult medical examination without abnormal findings: Secondary | ICD-10-CM

## 2010-11-30 ENCOUNTER — Ambulatory Visit (HOSPITAL_COMMUNITY)
Admission: RE | Admit: 2010-11-30 | Discharge: 2010-11-30 | Disposition: A | Discharge status: Home / Self Care | Payer: BC Managed Care – PPO | Source: Ambulatory Visit | Attending: Pulmonary Disease | Admitting: Pulmonary Disease

## 2010-11-30 DIAGNOSIS — Z1231 Encounter for screening mammogram for malignant neoplasm of breast: Secondary | ICD-10-CM | POA: Insufficient documentation

## 2010-12-02 ENCOUNTER — Encounter (INDEPENDENT_AMBULATORY_CARE_PROVIDER_SITE_OTHER): Payer: Self-pay | Admitting: *Deleted

## 2010-12-02 ENCOUNTER — Encounter: Payer: Self-pay | Admitting: Pulmonary Disease

## 2010-12-02 ENCOUNTER — Other Ambulatory Visit: Payer: BC Managed Care – PPO

## 2010-12-02 ENCOUNTER — Other Ambulatory Visit: Payer: Self-pay | Admitting: Pulmonary Disease

## 2010-12-02 DIAGNOSIS — E785 Hyperlipidemia, unspecified: Secondary | ICD-10-CM

## 2010-12-02 DIAGNOSIS — Z0000 Encounter for general adult medical examination without abnormal findings: Secondary | ICD-10-CM

## 2010-12-02 LAB — URINALYSIS
Bilirubin Urine: NEGATIVE
Hgb urine dipstick: NEGATIVE
Leukocytes, UA: NEGATIVE
Nitrite: NEGATIVE
Urine Glucose: NEGATIVE
Urobilinogen, UA: 0.2 (ref 0.0–1.0)

## 2010-12-02 LAB — BASIC METABOLIC PANEL
Calcium: 9.6 mg/dL (ref 8.4–10.5)
Creatinine, Ser: 0.7 mg/dL (ref 0.4–1.2)
GFR: 105 mL/min (ref >60.00)
Potassium: 4.3 mEq/L (ref 3.5–5.1)
Sodium: 141 mEq/L (ref 135–145)

## 2010-12-02 LAB — CBC WITH DIFFERENTIAL/PLATELET
Basophils Absolute: 0 10*3/uL (ref 0.0–0.1)
Eosinophils Relative: 1.5 % (ref 0.0–5.0)
Hemoglobin: 13.3 g/dL (ref 12.0–15.0)
Lymphs Abs: 1 10*3/uL (ref 0.7–4.0)
MCHC: 33.1 g/dL (ref 30.0–36.0)
Monocytes Absolute: 0.4 10*3/uL (ref 0.1–1.0)
Monocytes Relative: 13.7 % — ABNORMAL HIGH (ref 3.0–12.0)
Neutro Abs: 1.4 10*3/uL (ref 1.4–7.7)
Platelets: 167 10*3/uL (ref 150.0–400.0)
RBC: 4.9 Mil/uL (ref 3.87–5.11)
RDW: 14.6 % (ref 11.5–14.6)

## 2010-12-02 LAB — LIPID PANEL: VLDL: 8.2 mg/dL (ref 0.0–40.0)

## 2010-12-02 LAB — HEPATIC FUNCTION PANEL
ALT: 23 U/L (ref 0–35)
AST: 22 U/L (ref 0–37)
Albumin: 4.2 g/dL (ref 3.5–5.2)
Alkaline Phosphatase: 70 U/L (ref 39–117)
Bilirubin, Direct: 0.1 mg/dL (ref 0.0–0.3)
Total Bilirubin: 0.6 mg/dL (ref 0.3–1.2)
Total Protein: 7.7 g/dL (ref 6.0–8.3)

## 2010-12-02 LAB — LDL CHOLESTEROL, DIRECT: Direct LDL: 127.4 mg/dL

## 2010-12-20 ENCOUNTER — Telehealth: Payer: Self-pay | Admitting: Pulmonary Disease

## 2010-12-22 NOTE — Telephone Encounter (Signed)
LMTCBx. Carron Curie, CMA

## 2010-12-22 NOTE — Assessment & Plan Note (Signed)
Summary: 1 yr rov ///kp   Primary Care Provider:  Alroy Dust, MD  CC:  Yearly ROV & CPX....  History of Present Illness: 60 y/o BF here for a follow up visit & CPX... Amy Haynes has several issues to discuss>     First Amy Haynes notes sleep issues but thinks they may be stress related;  I offered Amy Haynes a sleep study for further eval & Amy Haynes will think about it & let me know if Amy Haynes wants to proceed...'    Second Amy Haynes voices concern over the simvastatin and memory loss quoting several news reports etc; we discussed the lower incidence of MI & strokes on these meds but Amy Haynes is undeterred & wants off the Simva20- in fact stopped it 2 weeks ago & Amy Haynes FLP off the med showed TChol 205, TG 41, HDL 63, LDL 127;  we reviewed the goals of therapy w/ LDL <100 & Amy Haynes will work on diet (wt down 3# to 167#)...    BP remains controlled on meds & BP = 124/84 here today, similar at home & tol Rx well...    Review of CPX> CXR, EKG, Labs shows Vit D level= 15, and we rec starting 5000 u OTC Vit D supplement daily (plan f/u level on ret)... up to date on colonoscopy & vaccinations.      Current Problems:   Hx of BRONCHITIS (ICD-490) - Amy Haynes is a never smoker... hx bronchitic infections in the past- no recent symptoms... ??remote hx of Sarcoidosis in the 1970's- ?details, and no residual problems...  ~  baseline CXR clear, NAD.Marland Kitchen.  ~  CXR 2/11 showed min tort Ao, clear lungs, WNL...  HYPERTENSION (ICD-401.9) - on LOTREL 10-20 daily + MAXZIDE-25 daily... BP = 124/84 today, feels well, checks BP occas at school- "all good"...  denies HA, fatigue, visual changes, CP, palipit, dizziness, syncope, dyspnea, edema, etc...   VENOUS INSUFFICIENCY (ICD-459.81) - Amy Haynes follows a low sodium diet, elevates legs, & wears support hose Prn...  HYPERCHOLESTEROLEMIA (ICD-272.0) - on SIMVASTATIN 20mg /d w/ good response...  ~  prev FLP's showed TChol 193-223, TG 46-68, HDL 48-64, LDL 126-152... all these on diet alone.  ~  FLP 10/07 on diet alone showed  TChol 203, TG 42, HDL 55, LDL 132... rec- start Simva20.  ~  FLP 11/08 on Simva20 showed TChol 139, TG 31, HDL 49, LDL 84... rec- continue same.  ~  FLP 12/09 ?on Simva20 showed TChol 187, TG 40, HDL 49, LDL 130... rec> take Simva20 daily.  ~  FLP 2/11 on Simva20 showed TChol 148, TG 46, HDL 61, LDL 77... rec> keep same.  DIVERTICULOSIS OF COLON (ICD-562.10) - colonoscopy 11/07 by DrPerry showed divertics only... f/u planned 10 yrs...  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  Hx of MICROCYTOSIS (ICD-790.09) - Amy Haynes CBC's x yrs have shown normal Hg, but small cells w/ MCV in the 79-80 range--- ? prev iron studies? Amy Haynes is post menopausal, all stool checks have been neg...  ~  labs 12/09 showed Hg= 13.0, MCV= 81  ~  labs 2/11 showed Hg= 12.7, MCV= 83, Fe= 68   Preventive Screening-Counseling & Management  Alcohol-Tobacco     Smoking Status: never  Allergies (verified): No Known Drug Allergies  Comments:  Nurse/Medical Assistant: The patient's medications and allergies were reviewed with the patient and were updated in the Medication and Allergy Lists.  Past History:  Past Medical History: Hx of BRONCHITIS (ICD-490) HYPERTENSION (ICD-401.9) VENOUS INSUFFICIENCY (ICD-459.81) HYPERCHOLESTEROLEMIA (ICD-272.0) DIVERTICULOSIS OF  COLON (ICD-562.10) ANXIETY (ICD-300.00) Hx of MICROCYTOSIS (ICD-790.09)  Past Surgical History: S/P foot surgery in 1991  Family History: Reviewed history from 08/28/2008 and no changes required. Father died ?age ?DM- didn't know him... Mother alive age 42 w/ HBP and arthritis... 4 Siblings:  1 Sis w/ HBP & arthritis; 3 sibs- ?don't know them...  Social History: Reviewed history from 08/28/2008 and no changes required. Divorced 2 Children Never smoked Social Etoh Not exercising  Review of Systems      See HPI  The patient denies anorexia, fever, weight loss, weight gain, vision loss, decreased hearing,  hoarseness, chest pain, syncope, dyspnea on exertion, peripheral edema, prolonged cough, headaches, hemoptysis, abdominal pain, melena, hematochezia, severe indigestion/heartburn, hematuria, incontinence, muscle weakness, suspicious skin lesions, transient blindness, difficulty walking, depression, unusual weight change, abnormal bleeding, enlarged lymph nodes, and angioedema.    Vital Signs:  Patient profile:   60 year old female Height:      63.5 inches Weight:      167.25 pounds BMI:     29.27 O2 Sat:      99 % on Room air Temp:     98.3 degrees F oral Pulse rate:   55 / minute BP sitting:   124 / 84  (left arm) Cuff size:   regular  Vitals Entered By: Randell Loop CMA (November 29, 2010 3:38 PM)  O2 Sat at Rest %:  99 O2 Flow:  Room air CC: Yearly ROV & CPX... Is Patient Diabetic? No Pain Assessment Patient in pain? no      Comments meds updated today with pt   Physical Exam  Additional Exam:  WD, WN, 60 y/o BF in NAD... GENERAL:  Alert & oriented; pleasant & cooperative... HEENT:  [place]/AT, EOM-wnl, PERRLA, EACs-clear, TMs-wnl, NOSE-clear, THROAT-clear & wnl. NECK:  Supple w/ full ROM; no JVD; normal carotid impulses w/o bruits; no thyromegaly or nodules palpated; no lymphadenopathy. CHEST:  Clear to P & A; without wheezes/ rales/ or rhonchi. HEART:  Regular Rhythm; without murmurs/ rubs/ or gallops. ABDOMEN:  Soft & nontender; normal bowel sounds; no organomegaly or masses detected. EXT: without deformities, mild arthritic changes; no varicose veins/ +venous insuffic/ no edema. NEURO:  CN's intact; motor testing normal; sensory testing normal; gait normal & balance OK. DERM:  No lesions noted; no rash etc...    Impression & Recommendations:  Problem # 1:  PHYSICAL EXAMINATION (ICD-V70.0)  Orders: EKG w/ Interpretation (93000)  >>  SBrady rate 56, no acute changes... T-2 View CXR (71020TC)  >>  borderline hrt size, tort aorta, NAD Return for FASTING blood  work...  Problem # 2:  HYPERTENSION (ICD-401.9) BP controlled>  continue same meds... Amy Haynes updated medication list for this problem includes:    Amlodipine Besy-benazepril Hcl 10-20 Mg Caps (Amlodipine besy-benazepril hcl) .Marland Kitchen... Take 1 tablet by mouth once a day    Triamterene-hctz 37.5-25 Mg Tabs (Triamterene-hctz) .Marland Kitchen... Take 1 tablet by mouth once a day  Problem # 3:  HYPERCHOLESTEROLEMIA (ICD-272.0) As noted Amy Haynes declines to take statin meds & wants to control on diet alone... I pointed out that Amy Haynes has borderline cardiomeg on CXR & tort aorta as a sign of "hardening of the arteries" & that the rec is for max risk factor reduction strategy & Amy Haynes understands this approach but declines to take any statin or other chol rx at this time. The following medications were removed from the medication list:    Simvastatin 20 Mg Tabs (Simvastatin) .Marland Kitchen... Take 1 tablet by  mouth once a day  Problem # 4:  DIVERTICULOSIS OF COLON (ICD-562.10) Sehis up to date on colon screen w/ another colonoscopy due 11/17...  Problem # 5:  ANXIETY (ICD-300.00) Amy Haynes is under stress but declines anxiolytic rx...  Complete Medication List: 1)  Amlodipine Besy-benazepril Hcl 10-20 Mg Caps (Amlodipine besy-benazepril hcl) .... Take 1 tablet by mouth once a day 2)  Triamterene-hctz 37.5-25 Mg Tabs (Triamterene-hctz) .... Take 1 tablet by mouth once a day 3)  Vitamin D3 5000 Unit Caps (Cholecalciferol) .... Take 1 cap by mouth once daily...  Patient Instructions: 1)  Today we updated your med list- see below... 2)  We refilled your meds for 2012... 3)  Today we di your f/u CXR & EKG... 4)  Please return to our lab one morning this week for your FASTING blood work... 5)  Then please call the "phone tree" in a few days for your results.Marland KitchenMarland Kitchen  6)  Keep up the good work w/ diet & exercise... 7)  Call for any problems & let me know if you want to pursue a sleep study.Marland KitchenMarland Kitchen 8)  Please schedule a follow-up appointment in 1  year. Prescriptions: TRIAMTERENE-HCTZ 37.5-25 MG TABS (TRIAMTERENE-HCTZ) Take 1 tablet by mouth once a day  #30 x 12   Entered and Authorized by:   Michele Mcalpine MD   Signed by:   Michele Mcalpine MD on 11/29/2010   Method used:   Print then Give to Patient   RxID:   [id number] AMLODIPINE BESY-BENAZEPRIL HCL 10-20 MG CAPS (AMLODIPINE BESY-BENAZEPRIL HCL) Take 1 tablet by mouth once a day  #30 x 12   Entered and Authorized by:   Michele Mcalpine MD   Signed by:   Michele Mcalpine MD on 11/29/2010   Method used:   Print then Give to Patient   RxID:   [id number]

## 2010-12-23 ENCOUNTER — Telehealth: Payer: Self-pay | Admitting: Pulmonary Disease

## 2010-12-23 NOTE — Telephone Encounter (Signed)
lmomtcb  

## 2010-12-23 NOTE — Telephone Encounter (Signed)
Pt returned call

## 2010-12-23 NOTE — Telephone Encounter (Signed)
Pt returned call. Amy Haynes  °

## 2010-12-26 ENCOUNTER — Telehealth: Payer: Self-pay | Admitting: Pulmonary Disease

## 2010-12-26 NOTE — Telephone Encounter (Signed)
Per SN---recs to restart on the simvastatin 20mg   1 at bedtime  #90 with 3 refills.   Vit d was low and recs for otc  5000 units daily.  Thanks

## 2010-12-26 NOTE — Telephone Encounter (Signed)
lmomtcb x1 

## 2010-12-26 NOTE — Telephone Encounter (Signed)
Called and spoke with pt and she states at last vo 12/02/10 pt was informed that her cholesterol was elevated and pt was to decide if she wanted to continue on the simvastatin or not. Pt states she would like to continue simvastatin. Please advise Dr. Kriste Basque. Thanks

## 2010-12-27 NOTE — Telephone Encounter (Signed)
Duplicate message. 

## 2010-12-27 NOTE — Telephone Encounter (Signed)
lmomtcb x 2  

## 2010-12-28 MED ORDER — SIMVASTATIN 20 MG PO TABS: 20.0000 mg | ORAL_TABLET | Freq: Every evening | ORAL | Status: DC

## 2010-12-28 NOTE — Telephone Encounter (Signed)
Pt is aware to restart simvastatin 20 mg at bedtime and otc vitamin d 5000 units daily. Will send RX to [facility] on [address] per pt request.

## 2011-03-14 NOTE — Telephone Encounter (Signed)
Note created in error. Will sign off.

## 2011-10-30 ENCOUNTER — Other Ambulatory Visit: Payer: Self-pay | Admitting: Pulmonary Disease

## 2011-10-30 DIAGNOSIS — Z1231 Encounter for screening mammogram for malignant neoplasm of breast: Secondary | ICD-10-CM

## 2011-12-01 ENCOUNTER — Ambulatory Visit (HOSPITAL_COMMUNITY)
Admission: RE | Admit: 2011-12-01 | Discharge: 2011-12-01 | Disposition: A | Discharge status: Home / Self Care | Payer: BC Managed Care – PPO | Source: Ambulatory Visit | Attending: Pulmonary Disease | Admitting: Pulmonary Disease

## 2011-12-01 DIAGNOSIS — Z1231 Encounter for screening mammogram for malignant neoplasm of breast: Secondary | ICD-10-CM | POA: Insufficient documentation

## 2011-12-18 ENCOUNTER — Encounter: Payer: Self-pay | Admitting: *Deleted

## 2011-12-18 ENCOUNTER — Ambulatory Visit (INDEPENDENT_AMBULATORY_CARE_PROVIDER_SITE_OTHER): Payer: BC Managed Care – PPO | Admitting: Pulmonary Disease

## 2011-12-18 VITALS — BP 122/72 | HR 56 | Temp 98.9°F | Ht 63.5 in | Wt 168.8 lb

## 2011-12-18 DIAGNOSIS — F411 Generalized anxiety disorder: Secondary | ICD-10-CM

## 2011-12-18 DIAGNOSIS — I10 Essential (primary) hypertension: Secondary | ICD-10-CM

## 2011-12-18 DIAGNOSIS — K5730 Diverticulosis of large intestine without perforation or abscess without bleeding: Secondary | ICD-10-CM

## 2011-12-18 DIAGNOSIS — I872 Venous insufficiency (chronic) (peripheral): Secondary | ICD-10-CM

## 2011-12-18 DIAGNOSIS — D649 Anemia, unspecified: Secondary | ICD-10-CM

## 2011-12-18 DIAGNOSIS — Z0000 Encounter for general adult medical examination without abnormal findings: Secondary | ICD-10-CM

## 2011-12-18 DIAGNOSIS — E7800 Pure hypercholesterolemia, unspecified: Secondary | ICD-10-CM

## 2011-12-18 DIAGNOSIS — E039 Hypothyroidism, unspecified: Secondary | ICD-10-CM

## 2011-12-18 MED ORDER — AMLODIPINE BESY-BENAZEPRIL HCL 10-20 MG PO CAPS: 1.0000 | ORAL_CAPSULE | Freq: Every day | ORAL | Status: DC

## 2011-12-18 MED ORDER — TRIAMTERENE-HCTZ 37.5-25 MG PO TABS: 1.0000 | ORAL_TABLET | Freq: Every day | ORAL | Status: DC

## 2011-12-18 MED ORDER — SIMVASTATIN 20 MG PO TABS: 20.0000 mg | ORAL_TABLET | Freq: Every evening | ORAL | Status: DC

## 2011-12-18 NOTE — Patient Instructions (Signed)
Today we updated your med list in our EPIC system...    Continue your current medications the same...  Please return to our lab one morning next week for your FASTING blood work...    We will call you w/ the results when avail...  Keep up the good work w/ diet & exercise...  Call for any questions.Marland KitchenMarland Kitchen

## 2011-12-18 NOTE — Progress Notes (Addendum)
Subjective:     Patient ID: Amy Haynes, female   DOB: [birth year], 61 y.o.   MRN: [id number]  HPI 61 y/o BF here for a follow up visit & CPX...   ~  November 29, 2010:  Yearly ROV & she has several issues to discuss>     First she notes sleep issues but thinks they may be stress related;  I offered her a sleep study for further eval & she will think about it & let me know if she wants to proceed...'    Second she voices concern over the simvastatin and memory loss quoting several news reports etc; we discussed the lower incidence of MI & strokes on these meds but she is undeterred & wants off the Simva20- in fact stopped it 2 weeks ago & her FLP off the med showed TChol 205, TG 41, HDL 63, LDL 127;  we reviewed the goals of therapy w/ LDL <100 & she will work on diet (wt down 3# to 167#)...    BP remains controlled on meds & BP = 124/84 here today, similar at home & tol Rx well...    Review of CPX> CXR, EKG, Labs shows Vit D level= 15, and we rec starting 5000 u OTC Vit D supplement daily (plan f/u level on ret)... up to date on colonoscopy & vaccinations.  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   PROBLEM LIST:    Hx of BRONCHITIS (ICD-490) - she is a never smoker... hx bronchitic infections in the past- no recent symptoms... ??remote hx of Sarcoidosis in the 1970's- ?details, and no residual problems... ~  baseline CXR clear, NAD.Marland Kitchen. ~  CXR 2/11 showed min tort Ao, clear lungs, WNL.Marland Kitchen. ~  CXR 3/12 showed heart at upper lim normal, tort Ao, clear lungs, NAD...  HYPERTENSION (ICD-401.9) - on LOTREL 10-20 daily + MAXZIDE-25 daily...  ~  3/12: BP = 124/84 today, feels well,  checks BP occas at school- "all good"> denies HA, fatigue, visual changes, CP, palipit, dizziness, syncope, dyspnea, edema, etc. ~  3/13: BP=` 122/72 and she remains asymptomatic...  VENOUS INSUFFICIENCY (ICD-459.81) - she follows a low sodium diet, elevates legs, & wears support hose Prn...  HYPERCHOLESTEROLEMIA (ICD-272.0) - on SIMVASTATIN 20mg /d w/ good response... ~  prev FLP's showed TChol 193-223, TG 46-68, HDL 48-64, LDL 126-152... all these on diet alone. ~  FLP 10/07 on diet alone showed TChol 203, TG 42, HDL 55, LDL 132... rec- start Simva20. ~  FLP 11/08 on Simva20 showed TChol 139, TG 31, HDL 49, LDL 84... rec- continue same. ~  FLP 12/09 ?on Simva20 showed TChol 187, TG 40, HDL 49, LDL 130... rec> take Simva20 daily. ~  FLP 2/11 on Simva20 showed TChol 148, TG 46, HDL 61, LDL 77... rec> keep same. ~  FLP 3/12 on Simva20 showed TChol 205, TG 41, HDL 63, LDL 127... Needs better diet, take med every day. ~  FLP 3/13 on Simva20 showed TChol 136, TG 43, HDL 61, LDL 66... Much better.  DIVERTICULOSIS OF COLON (ICD-562.10) - colonoscopy 11/07 by DrPerry showed divertics only.Marland KitchenMarland Kitchen  f/u planned 10 yrs...  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  Hx of MICROCYTOSIS (ICD-790.09) - her CBC's x yrs have shown normal Hg, but small cells w/ MCV in the 79-80 range--- ? prev iron studies? she is post menopausal, all stool checks have been neg... ~  labs 12/09 showed Hg= 13.0, MCV= 81 ~  labs 2/11 showed Hg= 12.7, MCV= 83, Fe= 68 ~  Labs 3/12 showed Hg= 13.3, MCV= 82 ~  Labs 3/13 showed Hg= 13.2, MCV= 81   Past Surgical History  Procedure Date  . Foot surgery 1991    Outpatient Encounter Prescriptions as of 12/18/2011  Medication Sig Dispense Refill  . simvastatin (ZOCOR) 20 MG tablet Take 1 tablet (20 mg total) by mouth every evening.  30 tablet  11  . triamterene-hydrochlorothiazide (DYAZIDE) 37.5-25 MG per capsule Take 1 capsule by mouth every morning.         No Known Allergies   Current Medications, Allergies, Past Medical History, Past Surgical History, Family History, and Social History were reviewed in [employer] record.   Review of Systems         See HPI - all other systems neg except as noted... The patient denies anorexia, fever, weight loss, weight gain, vision loss, decreased hearing, hoarseness, chest pain, syncope, dyspnea on exertion, peripheral edema, prolonged cough, headaches, hemoptysis, abdominal pain, melena, hematochezia, severe indigestion/heartburn, hematuria, incontinence, muscle weakness, suspicious skin lesions, transient blindness, difficulty walking, depression, unusual weight change, abnormal bleeding, enlarged lymph nodes, and angioedema.     Objective:   Physical Exam    WD, WN, 61 y/o BF in NAD... GENERAL:  Alert & oriented; pleasant & cooperative... HEENT:  [place]/AT, EOM-wnl, PERRLA, EACs-clear, TMs-wnl, NOSE-clear, THROAT-clear & wnl. NECK:  Supple w/ full ROM; no JVD; normal carotid impulses w/o bruits; no thyromegaly or nodules palpated; no lymphadenopathy. CHEST:  Clear to P & A; without wheezes/ rales/ or rhonchi. HEART:  Regular Rhythm; without murmurs/ rubs/ or gallops. ABDOMEN:  Soft & nontender; normal bowel sounds; no organomegaly or masses detected. EXT: without deformities, mild arthritic changes; no varicose veins/ +venous insuffic/ no edema. NEURO:  CN's intact; motor testing normal; sensory testing normal; gait normal & balance OK. DERM:  No lesions noted; no rash etc...  RADIOLOGY DATA:  Reviewed in the EPIC EMR & discussed w/ the patient...    >>Last CXR 3/12 showed heart at upper lim normal, tort Ao, clear lungs, NAD...  LABORATORY DATA:  Reviewed in the EPIC EMR & discussed w/ the patient...    >>LABS 3/13:  FLP- at goals on Simva20;  Chems- wnl;  CBC- wnl;  TSH=0.81;  VitD=50;  UA- clear...   Assessment:     CPX>>  HBP>  Remains well regulated on Lotrel &  Maxzide; labs look good; continue same...  Ven Insuffic>  She knows to elim salt, elev legs, wear support hose, & she is on the diuretic...  Chol>  FLP at goal on Simva20, diet, exercise, etc...  Divertics>  Last colon 11/07 by DrPerry w/ divertics only & f/u planned 85yrs...  Anxiety>  Stress at work but she manages very well & not on axiolytic Rx...  Hx borderline anemia w/ microcytosis>  Hg actually normal w/ small cells; Fe has been ok & GI work up neg; we continue to follow...  Patient's Medications  New Prescriptions   No medications on file  Previous Medications   CHOLECALCIFEROL (VITAMIN D) 1000 UNITS TABLET  Take 1,000 Units by mouth daily.   MULTIPLE VITAMINS-MINERALS (MULTIVITAMIN & MINERAL PO)    Take 1 tablet by mouth daily.  Modified Medications   Modified Medication Previous Medication   AMLODIPINE-BENAZEPRIL (LOTREL) 10-20 MG PER CAPSULE amLODipine-benazepril (LOTREL) 10-20 MG per capsule      Take 1 capsule by mouth daily.    Take 1 capsule by mouth daily.   SIMVASTATIN (ZOCOR) 20 MG TABLET simvastatin (ZOCOR) 20 MG tablet      Take 1 tablet (20 mg total) by mouth every evening.    Take 1 tablet (20 mg total) by mouth every evening.   TRIAMTERENE-HYDROCHLOROTHIAZIDE (MAXZIDE-25) 37.5-25 MG PER TABLET triamterene-hydrochlorothiazide (MAXZIDE-25) 37.5-25 MG per tablet      Take 1 each (1 tablet total) by mouth daily.    Take 1 tablet by mouth daily.  Discontinued Medications   TRIAMTERENE-HYDROCHLOROTHIAZIDE (DYAZIDE) 37.5-25 MG PER CAPSULE    Take 1 capsule by mouth every morning.      Plan:

## 2011-12-23 ENCOUNTER — Other Ambulatory Visit: Payer: Self-pay | Admitting: Pulmonary Disease

## 2011-12-25 ENCOUNTER — Other Ambulatory Visit (INDEPENDENT_AMBULATORY_CARE_PROVIDER_SITE_OTHER): Payer: BC Managed Care – PPO

## 2011-12-25 DIAGNOSIS — Z0000 Encounter for general adult medical examination without abnormal findings: Secondary | ICD-10-CM

## 2011-12-25 LAB — HEPATIC FUNCTION PANEL
ALT: 28 U/L (ref 0–35)
AST: 25 U/L (ref 0–37)
Bilirubin, Direct: 0.1 mg/dL (ref 0.0–0.3)
Total Bilirubin: 0.3 mg/dL (ref 0.3–1.2)
Total Protein: 8 g/dL (ref 6.0–8.3)

## 2011-12-25 LAB — URINALYSIS, ROUTINE W REFLEX MICROSCOPIC
Bilirubin Urine: NEGATIVE
Hgb urine dipstick: NEGATIVE
Ketones, ur: NEGATIVE
Nitrite: NEGATIVE
Urobilinogen, UA: 0.2 (ref 0.0–1.0)

## 2011-12-25 LAB — CBC WITH DIFFERENTIAL/PLATELET
Basophils Absolute: 0 10*3/uL (ref 0.0–0.1)
Basophils Relative: 0.9 % (ref 0.0–3.0)
Eosinophils Absolute: 0.1 10*3/uL (ref 0.0–0.7)
Lymphocytes Relative: 37.7 % (ref 12.0–46.0)
MCHC: 32.5 g/dL (ref 30.0–36.0)
MCV: 81 fl (ref 78.0–100.0)
Monocytes Absolute: 0.4 10*3/uL (ref 0.1–1.0)
Neutrophils Relative %: 46 % (ref 43.0–77.0)
Platelets: 163 10*3/uL (ref 150.0–400.0)
RBC: 5.01 Mil/uL (ref 3.87–5.11)
RDW: 14.2 % (ref 11.5–14.6)

## 2011-12-25 LAB — BASIC METABOLIC PANEL
CO2: 30 mEq/L (ref 19–32)
Chloride: 104 mEq/L (ref 96–112)
Creatinine, Ser: 0.8 mg/dL (ref 0.4–1.2)
Potassium: 4.1 mEq/L (ref 3.5–5.1)

## 2011-12-25 LAB — LIPID PANEL
Cholesterol: 136 mg/dL (ref 0–200)
Total CHOL/HDL Ratio: 2
Triglycerides: 43 mg/dL (ref 0.0–149.0)

## 2011-12-25 LAB — TSH: TSH: 0.81 u[IU]/mL (ref 0.35–5.50)

## 2011-12-26 LAB — VITAMIN D 25 HYDROXY (VIT D DEFICIENCY, FRACTURES): Vit D, 25-Hydroxy: 50 ng/mL (ref 30–89)

## 2012-11-06 ENCOUNTER — Other Ambulatory Visit: Payer: Self-pay | Admitting: Pulmonary Disease

## 2012-11-06 DIAGNOSIS — Z1231 Encounter for screening mammogram for malignant neoplasm of breast: Secondary | ICD-10-CM

## 2012-12-02 ENCOUNTER — Ambulatory Visit (HOSPITAL_COMMUNITY): Payer: BC Managed Care – PPO

## 2012-12-06 ENCOUNTER — Ambulatory Visit (HOSPITAL_COMMUNITY)
Admission: RE | Admit: 2012-12-06 | Discharge: 2012-12-06 | Disposition: A | Discharge status: Home / Self Care | Payer: BC Managed Care – PPO | Source: Ambulatory Visit | Attending: Pulmonary Disease | Admitting: Pulmonary Disease

## 2012-12-06 DIAGNOSIS — Z1231 Encounter for screening mammogram for malignant neoplasm of breast: Secondary | ICD-10-CM | POA: Insufficient documentation

## 2012-12-18 ENCOUNTER — Other Ambulatory Visit: Payer: Self-pay | Admitting: Pulmonary Disease

## 2013-01-17 ENCOUNTER — Other Ambulatory Visit: Payer: Self-pay | Admitting: Pulmonary Disease

## 2013-01-29 ENCOUNTER — Encounter: Payer: Self-pay | Admitting: Pulmonary Disease

## 2013-01-29 ENCOUNTER — Ambulatory Visit (INDEPENDENT_AMBULATORY_CARE_PROVIDER_SITE_OTHER): Payer: BC Managed Care – PPO | Admitting: Pulmonary Disease

## 2013-01-29 ENCOUNTER — Ambulatory Visit (INDEPENDENT_AMBULATORY_CARE_PROVIDER_SITE_OTHER)
Admission: RE | Admit: 2013-01-29 | Discharge: 2013-01-29 | Disposition: A | Discharge status: Home / Self Care | Payer: BC Managed Care – PPO | Source: Ambulatory Visit | Attending: Pulmonary Disease | Admitting: Pulmonary Disease

## 2013-01-29 VITALS — BP 122/84 | HR 60 | Temp 98.0°F | Ht 63.5 in | Wt 171.0 lb

## 2013-01-29 DIAGNOSIS — I872 Venous insufficiency (chronic) (peripheral): Secondary | ICD-10-CM

## 2013-01-29 DIAGNOSIS — R718 Other abnormality of red blood cells: Secondary | ICD-10-CM

## 2013-01-29 DIAGNOSIS — I10 Essential (primary) hypertension: Secondary | ICD-10-CM

## 2013-01-29 DIAGNOSIS — Z0000 Encounter for general adult medical examination without abnormal findings: Secondary | ICD-10-CM

## 2013-01-29 DIAGNOSIS — E7800 Pure hypercholesterolemia, unspecified: Secondary | ICD-10-CM

## 2013-01-29 DIAGNOSIS — F411 Generalized anxiety disorder: Secondary | ICD-10-CM

## 2013-01-29 DIAGNOSIS — K5730 Diverticulosis of large intestine without perforation or abscess without bleeding: Secondary | ICD-10-CM

## 2013-01-29 MED ORDER — SIMVASTATIN 20 MG PO TABS: ORAL_TABLET | ORAL | Status: DC

## 2013-01-29 MED ORDER — TRIAMTERENE-HCTZ 37.5-25 MG PO TABS: ORAL_TABLET | ORAL | Status: DC

## 2013-01-29 MED ORDER — AMLODIPINE BESY-BENAZEPRIL HCL 10-20 MG PO CAPS: ORAL_CAPSULE | ORAL | Status: DC

## 2013-01-29 NOTE — Patient Instructions (Addendum)
Today we updated your med list in our EPIC system...    Continue your current medications the same...    We refilled your meds per request...  Congrats on your up-coming retirement!!!  Today we did your follow up CXR & EKG... Please return to our lab one morning soon for your FASTING blood work...    We will contact you w/ the results when available...   Call for any questions...  Let's plan a follow up visit in 77yr, sooner if needed for problems.Marland KitchenMarland Kitchen

## 2013-01-31 ENCOUNTER — Other Ambulatory Visit (INDEPENDENT_AMBULATORY_CARE_PROVIDER_SITE_OTHER): Payer: BC Managed Care – PPO

## 2013-01-31 DIAGNOSIS — Z0000 Encounter for general adult medical examination without abnormal findings: Secondary | ICD-10-CM

## 2013-01-31 LAB — CBC WITH DIFFERENTIAL/PLATELET
Basophils Relative: 0.7 % (ref 0.0–3.0)
Eosinophils Relative: 0.9 % (ref 0.0–5.0)
Hemoglobin: 13.5 g/dL (ref 12.0–15.0)
Lymphocytes Relative: 34.2 % (ref 12.0–46.0)
MCHC: 33.8 g/dL (ref 30.0–36.0)
MCV: 79.3 fl (ref 78.0–100.0)
Monocytes Absolute: 0.4 10*3/uL (ref 0.1–1.0)
Neutro Abs: 1.7 10*3/uL (ref 1.4–7.7)
Neutrophils Relative %: 52 % (ref 43.0–77.0)
RBC: 5.03 Mil/uL (ref 3.87–5.11)
WBC: 3.2 10*3/uL — ABNORMAL LOW (ref 4.5–10.5)

## 2013-01-31 LAB — HEPATIC FUNCTION PANEL
ALT: 23 U/L (ref 0–35)
Bilirubin, Direct: 0.1 mg/dL (ref 0.0–0.3)
Total Bilirubin: 0.8 mg/dL (ref 0.3–1.2)

## 2013-01-31 LAB — LIPID PANEL
Cholesterol: 141 mg/dL (ref 0–200)
HDL: 59.1 mg/dL (ref >39.00)
LDL Cholesterol: 76 mg/dL (ref 0–99)
Total CHOL/HDL Ratio: 2
Triglycerides: 28 mg/dL (ref 0.0–149.0)
VLDL: 5.6 mg/dL (ref 0.0–40.0)

## 2013-01-31 LAB — BASIC METABOLIC PANEL
BUN: 19 mg/dL (ref 6–23)
Calcium: 9.4 mg/dL (ref 8.4–10.5)
Chloride: 104 mEq/L (ref 96–112)
Creatinine, Ser: 0.8 mg/dL (ref 0.4–1.2)

## 2013-02-01 LAB — VITAMIN D 25 HYDROXY (VIT D DEFICIENCY, FRACTURES): Vit D, 25-Hydroxy: 57 ng/mL (ref 30–89)

## 2013-02-02 ENCOUNTER — Encounter: Payer: Self-pay | Admitting: Pulmonary Disease

## 2013-02-02 NOTE — Progress Notes (Signed)
Subjective:     Patient ID: Amy Haynes, female   DOB: [birth year], 62 y.o.   MRN: [id number]  HPI 62 y/o BF here for a follow up visit & CPX...   ~  November 29, 2010:  Yearly ROV & she has several issues to discuss>     First she notes sleep issues but thinks they may be stress related;  I offered her a sleep study for further eval & she will think about it & let me know if she wants to proceed...'    Second she voices concern over the simvastatin and memory loss quoting several news reports etc; we discussed the lower incidence of MI & strokes on these meds but she is undeterred & wants off the Simva20- in fact stopped it 2 weeks ago & her FLP off the med showed TChol 205, TG 41, HDL 63, LDL 127;  we reviewed the goals of therapy w/ LDL <100 & she will work on diet (wt down 3# to 167#)...    BP remains controlled on meds & BP = 124/84 here today, similar at home & tol Rx well...    Review of CPX> CXR, EKG, Labs shows Vit D level= 15, and we rec starting 5000 u OTC Vit D supplement daily (plan f/u level on ret)... up to date on colonoscopy & vaccinations.  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  ~  Jan 29, 2013:  Yearly ROV & CPX >> Amy Haynes reports a good yr w/o new complaints or concerns...    HxBronchitis> never smoker, ?remote hx Sarcoid in the 70's?, no recent cough/ sputum/ SOB/ etc...    HBP> on Lotrel10-20, Dyazide; BP= 122/84 and she denies CP, palpit, SOB, edema, etc...    VI> she knows to avoid sodium, elev legs, wear support hose...    Chol> on Simva20; FLP 5/14 shows TChol 141, TG 28, HDL 59, LDL 76    Divertics> she is asymptomatic w/o abd pain, n/v, c/d, blood  seen; last colon 11/07 by drPerry showed divertics only & f/u planned 16yrs...    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    Microcytosis> on MVI, VitD1000; Labs show Hg= 13.5 w/ MCV=79 We reviewed prob list, meds, xrays and labs> see below for updates >>  CXR 5/14 showed norm heart size, clear lungs x left basilar scarring, NAD.Marland KitchenMarland Kitchen EKG 5/14 showed SBrady, rate57, normal tracing, NAD... LABS 5/14:  FLP- at goals on Simva20;  Chems- wnl;  CBC- wnl w/ Hg=13.5 but MCV=79;  TSH=0.70;  VitD=57...          PROBLEM LIST:    Hx of BRONCHITIS (ICD-490) - she is a never smoker... hx bronchitic infections in the past- no recent symptoms... ??remote hx of Sarcoidosis in the 1970's- ?details, and no residual problems... ~  baseline CXR clear, NAD.Marland Kitchen. ~  CXR 2/11 showed min tort Ao, clear lungs, WNL.Marland Kitchen. ~  CXR 3/12 showed heart at upper lim normal, tort Ao, clear lungs, NAD.Marland Kitchen. ~  CXR 5/14 showed norm heart size, clear lungs x left  basilar scarring, NAD...   HYPERTENSION (ICD-401.9) - on LOTREL 10-20 daily + MAXZIDE-25 daily...  ~  3/12: BP = 124/84 today, feels well, checks BP occas at school- "all good"> denies HA, fatigue, visual changes, CP, palipit, dizziness, syncope, dyspnea, edema, etc. ~  3/13: BP= 122/72 and she remains asymptomatic... ~  5/14:   on Lotrel10-20, Dyazide; BP= 122/84 and she denies CP, palpit, SOB, edema, etc.  VENOUS INSUFFICIENCY (ICD-459.81) - she follows a low sodium diet, elevates legs, & wears support hose Prn...  HYPERCHOLESTEROLEMIA (ICD-272.0) - on SIMVASTATIN 20mg /d w/ good response... ~  prev FLP's showed TChol 193-223, TG 46-68, HDL 48-64, LDL 126-152... all these on diet alone. ~  FLP 10/07 on diet alone showed TChol 203, TG 42, HDL 55, LDL 132... rec- start Simva20. ~  FLP 11/08 on Simva20 showed TChol 139, TG 31, HDL 49, LDL 84... rec- continue same. ~  FLP 12/09 ?on Simva20 showed TChol 187, TG 40, HDL 49,  LDL 130... rec> take Simva20 daily. ~  FLP 2/11 on Simva20 showed TChol 148, TG 46, HDL 61, LDL 77... rec> keep same. ~  FLP 3/12 on Simva20 showed TChol 205, TG 41, HDL 63, LDL 127... Needs better diet, take med every day. ~  FLP 3/13 on Simva20 showed TChol 136, TG 43, HDL 61, LDL 66... Much better. ~  FLP 5/14 on Simva20 showed TChol 141, TG 28, HDL 59, LDL 76  DIVERTICULOSIS OF COLON (ICD-562.10) - colonoscopy 11/07 by DrPerry showed divertics only... f/u planned 10 yrs...  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  Hx of MICROCYTOSIS (ICD-790.09) - her CBC's x yrs have shown normal Hg, but small cells w/ MCV in the 79-80 range--- ? prev iron studies? she is post menopausal, all stool checks have been neg... ~  labs 12/09 showed Hg= 13.0, MCV= 81 ~  labs 2/11 showed Hg= 12.7, MCV= 83, Fe= 68 ~  Labs 3/12 showed Hg= 13.3, MCV= 82 ~  Labs 3/13 showed Hg= 13.2, MCV= 81 ~  Labs 5/14 showed Hg= 13.5, MCV= 79   Past Surgical History  Procedure Laterality Date  . Foot surgery  1991    Outpatient Encounter Prescriptions as of 01/29/2013  Medication Sig Dispense Refill  . amLODipine-benazepril (LOTREL) 10-20 MG per capsule take 1 capsule by mouth once daily  30 capsule  11  . cholecalciferol (VITAMIN D) 1000 UNITS tablet Take 1,000 Units by mouth daily.      . Multiple Vitamins-Minerals (MULTIVITAMIN & MINERAL PO) Take 1 tablet by mouth daily.      . simvastatin (ZOCOR) 20 MG tablet take 1 tablet by mouth every evening  30 tablet  11  . triamterene-hydrochlorothiazide (MAXZIDE-25) 37.5-25 MG per tablet take 1 tablet by mouth once daily  30 tablet  11  . [DISCONTINUED] amLODipine-benazepril (LOTREL) 10-20 MG per capsule take 1 capsule by mouth once daily  30 capsule  6  . [DISCONTINUED] simvastatin (ZOCOR) 20 MG tablet take 1 tablet by mouth every evening  30 tablet  2  . [DISCONTINUED] triamterene-hydrochlorothiazide (MAXZIDE-25) 37.5-25 MG per tablet take 1  tablet by mouth once daily  30 tablet  6   No facility-administered encounter medications on file as of 01/29/2013.    No Known Allergies   Current Medications, Allergies, Past Medical History, Past Surgical History, Family History, and Social History were reviewed in [employer] record.   Review of Systems         See  HPI - all other systems neg except as noted... The patient denies anorexia, fever, weight loss, weight gain, vision loss, decreased hearing, hoarseness, chest pain, syncope, dyspnea on exertion, peripheral edema, prolonged cough, headaches, hemoptysis, abdominal pain, melena, hematochezia, severe indigestion/heartburn, hematuria, incontinence, muscle weakness, suspicious skin lesions, transient blindness, difficulty walking, depression, unusual weight change, abnormal bleeding, enlarged lymph nodes, and angioedema.     Objective:   Physical Exam    WD, WN, 62 y/o BF in NAD... GENERAL:  Alert & oriented; pleasant & cooperative... HEENT:  [place]/AT, EOM-wnl, PERRLA, EACs-clear, TMs-wnl, NOSE-clear, THROAT-clear & wnl. NECK:  Supple w/ full ROM; no JVD; normal carotid impulses w/o bruits; no thyromegaly or nodules palpated; no lymphadenopathy. CHEST:  Clear to P & A; without wheezes/ rales/ or rhonchi. HEART:  Regular Rhythm; without murmurs/ rubs/ or gallops. ABDOMEN:  Soft & nontender; normal bowel sounds; no organomegaly or masses detected. EXT: without deformities, mild arthritic changes; no varicose veins/ +venous insuffic/ no edema. NEURO:  CN's intact; motor testing normal; sensory testing normal; gait normal & balance OK. DERM:  No lesions noted; no rash etc...  RADIOLOGY DATA:  Reviewed in the EPIC EMR & discussed w/ the patient...    >>Last CXR 3/12 showed heart at upper lim normal, tort Ao, clear lungs, NAD...  LABORATORY DATA:  Reviewed in the EPIC EMR & discussed w/ the patient...    >>LABS 3/13:  FLP- at goals on Simva20;  Chems- wnl;   CBC- wnl;  TSH=0.81;  VitD=50;  UA- clear...   Assessment:     CPX>>  HBP>  Remains well regulated on Lotrel & Maxzide; labs look good; continue same...  Ven Insuffic>  She knows to elim salt, elev legs, wear support hose, & she is on the diuretic...  Chol>  FLP at goal on Simva20, diet, exercise, etc...  Divertics>  Last colon 11/07 by DrPerry w/ divertics only & f/u planned 12yrs...  Anxiety>  Stress at work but she manages very well & not on axiolytic Rx...  Hx borderline anemia w/ microcytosis>  Hg actually normal w/ small cells; Fe has been ok & GI work up neg; we continue to follow...     Plan:     Patient's Medications  New Prescriptions   No medications on file  Previous Medications   CHOLECALCIFEROL (VITAMIN D) 1000 UNITS TABLET    Take 1,000 Units by mouth daily.   MULTIPLE VITAMINS-MINERALS (MULTIVITAMIN & MINERAL PO)    Take 1 tablet by mouth daily.  Modified Medications   Modified Medication Previous Medication   AMLODIPINE-BENAZEPRIL (LOTREL) 10-20 MG PER CAPSULE amLODipine-benazepril (LOTREL) 10-20 MG per capsule      take 1 capsule by mouth once daily    take 1 capsule by mouth once daily   SIMVASTATIN (ZOCOR) 20 MG TABLET simvastatin (ZOCOR) 20 MG tablet      take 1 tablet by mouth every evening    take 1 tablet by mouth every evening   TRIAMTERENE-HYDROCHLOROTHIAZIDE (MAXZIDE-25) 37.5-25 MG PER TABLET triamterene-hydrochlorothiazide (MAXZIDE-25) 37.5-25 MG per tablet      take 1 tablet by mouth once daily    take 1 tablet by mouth once daily  Discontinued Medications   No medications on file

## 2013-02-11 NOTE — Progress Notes (Signed)
Quick Note:  Pt aware of results. ______ 

## 2013-07-31 ENCOUNTER — Other Ambulatory Visit: Payer: Self-pay

## 2013-11-03 ENCOUNTER — Other Ambulatory Visit: Payer: Self-pay | Admitting: Pulmonary Disease

## 2013-11-03 DIAGNOSIS — Z1231 Encounter for screening mammogram for malignant neoplasm of breast: Secondary | ICD-10-CM

## 2013-12-08 ENCOUNTER — Ambulatory Visit (HOSPITAL_COMMUNITY)
Admission: RE | Admit: 2013-12-08 | Discharge: 2013-12-08 | Disposition: A | Discharge status: Home / Self Care | Payer: BC Managed Care – PPO | Source: Ambulatory Visit | Attending: Pulmonary Disease | Admitting: Pulmonary Disease

## 2013-12-08 DIAGNOSIS — Z1231 Encounter for screening mammogram for malignant neoplasm of breast: Secondary | ICD-10-CM | POA: Insufficient documentation

## 2014-01-27 ENCOUNTER — Telehealth: Payer: Self-pay | Admitting: Pulmonary Disease

## 2014-01-27 NOTE — Telephone Encounter (Signed)
Called and spoke with pt and she is aware of [facility] no longer doing primary care.  She stated that she will need to come in for cpx and will need labs and refills of meds.  She is aware that a new pcp will be starting at primary care in September and she will set up appt with Dr.Kollar.  We will discuss at her next ov on 5-18.

## 2014-02-09 ENCOUNTER — Ambulatory Visit (INDEPENDENT_AMBULATORY_CARE_PROVIDER_SITE_OTHER): Payer: BC Managed Care – PPO | Admitting: Pulmonary Disease

## 2014-02-09 ENCOUNTER — Encounter: Payer: Self-pay | Admitting: Pulmonary Disease

## 2014-02-09 ENCOUNTER — Other Ambulatory Visit (INDEPENDENT_AMBULATORY_CARE_PROVIDER_SITE_OTHER): Payer: BC Managed Care – PPO

## 2014-02-09 ENCOUNTER — Ambulatory Visit (INDEPENDENT_AMBULATORY_CARE_PROVIDER_SITE_OTHER)
Admission: RE | Admit: 2014-02-09 | Discharge: 2014-02-09 | Disposition: A | Discharge status: Home / Self Care | Payer: BC Managed Care – PPO | Source: Ambulatory Visit | Attending: Pulmonary Disease | Admitting: Pulmonary Disease

## 2014-02-09 VITALS — BP 120/84 | HR 52 | Temp 98.8°F | Ht 63.5 in | Wt 169.6 lb

## 2014-02-09 DIAGNOSIS — Z0000 Encounter for general adult medical examination without abnormal findings: Secondary | ICD-10-CM

## 2014-02-09 DIAGNOSIS — R718 Other abnormality of red blood cells: Secondary | ICD-10-CM

## 2014-02-09 DIAGNOSIS — I10 Essential (primary) hypertension: Secondary | ICD-10-CM

## 2014-02-09 DIAGNOSIS — E7800 Pure hypercholesterolemia, unspecified: Secondary | ICD-10-CM

## 2014-02-09 DIAGNOSIS — I872 Venous insufficiency (chronic) (peripheral): Secondary | ICD-10-CM

## 2014-02-09 DIAGNOSIS — Z23 Encounter for immunization: Secondary | ICD-10-CM

## 2014-02-09 DIAGNOSIS — K5730 Diverticulosis of large intestine without perforation or abscess without bleeding: Secondary | ICD-10-CM

## 2014-02-09 LAB — BASIC METABOLIC PANEL
BUN: 15 mg/dL (ref 6–23)
CO2: 30 meq/L (ref 19–32)
Calcium: 9.6 mg/dL (ref 8.4–10.5)
Chloride: 103 mEq/L (ref 96–112)
Creatinine, Ser: 0.7 mg/dL (ref 0.4–1.2)
GFR: 103.88 mL/min (ref >60.00)
Glucose, Bld: 82 mg/dL (ref 70–99)
Potassium: 4 mEq/L (ref 3.5–5.1)
SODIUM: 140 meq/L (ref 135–145)

## 2014-02-09 LAB — HEPATIC FUNCTION PANEL
ALK PHOS: 69 U/L (ref 39–117)
ALT: 41 U/L — ABNORMAL HIGH (ref 0–35)
AST: 34 U/L (ref 0–37)
Albumin: 4 g/dL (ref 3.5–5.2)
BILIRUBIN DIRECT: 0 mg/dL (ref 0.0–0.3)
TOTAL PROTEIN: 8.2 g/dL (ref 6.0–8.3)
Total Bilirubin: 0.7 mg/dL (ref 0.2–1.2)

## 2014-02-09 LAB — LIPID PANEL
CHOL/HDL RATIO: 2
Cholesterol: 155 mg/dL (ref 0–200)
HDL: 67.6 mg/dL (ref >39.00)
LDL Cholesterol: 79 mg/dL (ref 0–99)
Triglycerides: 42 mg/dL (ref 0.0–149.0)
VLDL: 8.4 mg/dL (ref 0.0–40.0)

## 2014-02-09 LAB — CBC WITH DIFFERENTIAL/PLATELET
Basophils Absolute: 0 10*3/uL (ref 0.0–0.1)
Basophils Relative: 0.5 % (ref 0.0–3.0)
Eosinophils Absolute: 0 10*3/uL (ref 0.0–0.7)
Eosinophils Relative: 0.9 % (ref 0.0–5.0)
HCT: 41.1 % (ref 36.0–46.0)
Hemoglobin: 13.4 g/dL (ref 12.0–15.0)
LYMPHS PCT: 31.1 % (ref 12.0–46.0)
Lymphs Abs: 1.3 10*3/uL (ref 0.7–4.0)
MCHC: 32.5 g/dL (ref 30.0–36.0)
MCV: 81.3 fl (ref 78.0–100.0)
Monocytes Absolute: 0.4 10*3/uL (ref 0.1–1.0)
Monocytes Relative: 10.1 % (ref 3.0–12.0)
NEUTROS PCT: 57.4 % (ref 43.0–77.0)
Neutro Abs: 2.4 10*3/uL (ref 1.4–7.7)
Platelets: 185 10*3/uL (ref 150.0–400.0)
RBC: 5.05 Mil/uL (ref 3.87–5.11)
RDW: 15 % (ref 11.5–15.5)
WBC: 4.1 10*3/uL (ref 4.0–10.5)

## 2014-02-09 LAB — IBC PANEL
IRON: 72 ug/dL (ref 42–145)
SATURATION RATIOS: 19.9 % — AB (ref 20.0–50.0)
TRANSFERRIN: 258 mg/dL (ref 212.0–360.0)

## 2014-02-09 LAB — TSH: TSH: 0.42 u[IU]/mL (ref 0.35–4.50)

## 2014-02-09 MED ORDER — SIMVASTATIN 20 MG PO TABS: ORAL_TABLET | ORAL | Status: DC

## 2014-02-09 MED ORDER — TRIAMTERENE-HCTZ 37.5-25 MG PO TABS: ORAL_TABLET | ORAL | Status: DC

## 2014-02-09 MED ORDER — AMLODIPINE BESY-BENAZEPRIL HCL 10-20 MG PO CAPS: ORAL_CAPSULE | ORAL | Status: DC

## 2014-02-09 NOTE — Patient Instructions (Signed)
Today we updated your med list in our EPIC system...    Continue your current medications the same...  Today we did your follow up CXR, EKG, & fasting blood work...    We will contact you w/ the results when available...   We will arrange for a baseline Bone Density test as well...  Keep up the good work w/ diet & exercise...  Call for any questions or if we can be of service in any way.Marland KitchenMarland Kitchen

## 2014-02-09 NOTE — Progress Notes (Signed)
Subjective:     Patient ID: Amy Haynes, female   DOB: 1950/12/20, 63 y.o.   MRN: [id number]  HPI 63 y/o BF here for a follow up visit & CPX...   ~  November 29, 2010:  Yearly ROV & Amy Haynes has several issues to discuss>     First Amy Haynes notes sleep issues but thinks they may be stress related;  I offered her a sleep study for further eval & Amy Haynes will think about it & let me know if Amy Haynes wants to proceed...'    Second Amy Haynes voices concern over the simvastatin and memory loss quoting several news reports etc; we discussed the lower incidence of MI & strokes on these meds but Amy Haynes is undeterred & wants off the Simva20- in fact stopped it 2 weeks ago & her FLP off the med showed TChol 205, TG 41, HDL 63, LDL 127;  we reviewed the goals of therapy w/ LDL <100 & Amy Haynes will work on diet (wt down 3# to 167#)...    BP remains controlled on meds & BP = 124/84 here today, similar at home & tol Rx well...    Review of CPX> CXR, EKG, Labs shows Vit D level= 15, and we rec starting 5000 u OTC Vit D supplement daily (plan f/u level on ret)... up to date on colonoscopy & vaccinations.  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  ~  Jan 29, 2013:  Yearly [place] >> Amy Haynes reports a good yr w/o new complaints or concerns...    HxBronchitis> never smoker, ?remote hx Sarcoid in the 70's?, no recent cough/ sputum/ SOB/ etc...    HBP> on Lotrel10-20, Dyazide; BP= 122/84 and Amy Haynes denies CP, palpit, SOB, edema, etc...    VI> Amy Haynes knows to avoid sodium, elev legs, wear support hose...    Chol> on Simva20; FLP 5/14 shows TChol 141, TG 28, HDL 59, LDL 76    Divertics> Amy Haynes is asymptomatic w/o abd pain, n/v, c/d, blood  seen; last colon 11/07 by drPerry showed divertics only & f/u planned 84yr...    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    Microcytosis> on MVI, VitD1000; Labs show Hg= 13.5 w/ MCV=79 We reviewed prob list, meds, xrays and labs> see below for updates >>   CXR 5/14 showed norm heart size, clear lungs x left basilar scarring, NAD..Marland KitchenMarland Kitchen EKG 5/14 showed SBrady, rate57, normal tracing, NAD...  LABS 5/14:  FLP- at goals on Simva20;  Chems- wnl;  CBC- wnl w/ Hg=13.5 but MCV=79;  TSH=0.70;  VitD=57...  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 Amy Haynes has no new complaints or concerns>>    HxBronchitis> never smoker, ?remote hx Sarcoid in the 70's?, no recent cough/ sputum/ SOB/ etc...    HBP> on Lotrel10-20, Dyazide; BP= 120/84 and Amy Haynes denies CP, palpit, SOB, edema, etc...    VI> Amy Haynes knows to avoid sodium, elev legs, wear support hose...    Chol> on  Simva20; FLP 5/15 shows TChol 155, TG 42, HDL 68, LDL 79    Divertics> Amy Haynes is asymptomatic w/o abd pain, n/v, c/d, blood seen; last colon 11/07 by DrPerry showed divertics only & f/u planned 74yr...    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    Microcytosis> on MVI, VitD1000; Labs show Hg= 13.4 w/ MCV=79 and Fe=72 (20%sat); asked to take MVI w/ Fe daily... We reviewed prob list, meds, xrays and labs> see below for updates >> Given TDAP today...   CXR 5/15 showed norm heart size, clear lungs, NAD..Marland KitchenMarland Kitchen EKG 5/15 showed SBrady, rate46, NAD, boderline tracing...  LABS 5/15:  FLP- at goals on Simva20;  Chems- wnl;  CBC- wnl w/ Hg=13.4, Fe=72 (20%sat);  TSH=0.42;  VitD=42 REC to take MVI w/ Fe daily & VitD 1000u daily...          PROBLEM LIST:    Hx of BRONCHITIS (ICD-490) - Amy Haynes is a never smoker... hx bronchitic infections in the past- no recent symptoms... ??remote  hx of Sarcoidosis in the 1970's- ?details, and no residual problems... ~  baseline CXR clear, NAD..Marland Kitchen ~  CXR 2/11 showed min tort Ao, clear lungs, WNL..Marland Kitchen ~  CXR 3/12 showed heart at upper lim normal, tort Ao, clear lungs, NAD..Marland Kitchen ~  CXR 5/14 showed norm heart size, clear lungs x left basilar scarring, NAD..Marland Kitchen  ~  CXR 5/15 showed norm heart size, clear lungs, NAD.  HYPERTENSION (ICD-401.9) - on LOTREL 10-20 daily + MAXZIDE-25 daily...  ~  3/12: BP = 124/84 today, feels well, checks BP occas at school- "all good"> denies HA, fatigue, visual changes, CP, palipit, dizziness, syncope, dyspnea, edema, etc. ~  3/13: BP= 122/72 and Amy Haynes remains asymptomatic... ~  5/14:  on Lotrel10-20, Dyazide; BP= 122/84 and Amy Haynes denies CP, palpit, SOB, edema, etc. ~  5/15:  on Lotrel10-20, Dyazide; BP= 120/84 and Amy Haynes denies CP, palpit, SOB, edema, etc  VENOUS INSUFFICIENCY (ICD-459.81) - Amy Haynes follows a low sodium diet, elevates legs, & wears support hose Prn...  HYPERCHOLESTEROLEMIA (ICD-272.0) - on SIMVASTATIN 218md w/ good response... ~  prev FLP's showed TChol 193-223, TG 46-68, HDL 48-64, LDL 126-152... all these on diet alone. ~  [place] on diet alone showed TChol 203, TG 42, HDL 55, LDL 132... rec- start Simva20. ~  [place]1/08 on Simva20 showed TChol 139, TG 31, HDL 49, LDL 84... rec- continue same. ~  FL[place]2/09 ?on Simva20 showed TChol 187, TG 40, HDL 49, LDL 130... rec> take Simva20 daily. ~  FLP 2/11 on Simva20 showed TChol 148, TG 46, HDL 61, LDL 77... rec> keep same. ~  [place]/12 on Simva20 showed TChol 205, TG 41, HDL 63, LDL 127... Needs better diet, take med every day. ~  FLP 3/13 on Simva20 showed TChol 136, TG 43, HDL 61, LDL 66... Much better. ~  FLP 5/14 on Simva20 showed TChol 141, TG 28, HDL 59, LDL 76 ~  FLP 5/15 on Simva20 showed TChol 155, TG 42, HDL 68, LDL 79  DIVERTICULOSIS OF COLON (ICD-562.10) - colonoscopy 11/07 by DrPerry showed divertics only... f/u planned 10 yrs...  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  Hx of MICROCYTOSIS (ICD-790.09) - her CBC's x yrs have shown normal Hg, but small cells w/ MCV in the 79-80 range--- ? prev iron studies? Amy Haynes is post menopausal, all stool checks have been neg... ~  labs 12/09 showed Hg= 13.0, MCV= 81 ~  labs 2/11 showed Hg= 12.7, MCV= 83, Fe= 68 ~  Labs 3/12 showed Hg= 13.3, MCV= 82 ~  Labs 3/13 showed Hg= 13.2, MCV= 81 ~  Labs 5/14 showed Hg= 13.5, MCV= 79 ~  Labs 5/15 showed Hg= 13.4 w/ MCV=79 and Fe=72 (20%sat); asked to take MVI w/ Fe daily..   Past Surgical History  Procedure Laterality Date  . Foot surgery  1991    Outpatient Encounter Prescriptions as of 02/09/2014  Medication Sig  . amLODipine-benazepril (LOTREL) 10-20 MG per capsule take 1 capsule by mouth once daily  . cholecalciferol (VITAMIN D) 1000 UNITS tablet Take 1,000 Units by mouth daily.  . Multiple Vitamins-Minerals (MULTIVITAMIN & MINERAL PO) Take 1 tablet by mouth daily.  . simvastatin (ZOCOR) 20 MG tablet take 1 tablet by mouth every evening  . triamterene-hydrochlorothiazide (MAXZIDE-25) 37.5-25 MG per tablet take 1 tablet by mouth once daily    No Known Allergies   Current Medications, Allergies, Past Medical History, Past Surgical History, Family History, and Social History were reviewed in [employer] record.   Review of Systems         See HPI - all other systems neg except as noted... The patient denies anorexia, fever, weight loss, weight gain, vision loss, decreased hearing, hoarseness, chest pain, syncope, dyspnea on exertion, peripheral edema, prolonged cough, headaches, hemoptysis, abdominal pain, melena, hematochezia, severe indigestion/heartburn, hematuria, incontinence, muscle weakness, suspicious skin lesions, transient blindness, difficulty walking, depression, unusual weight change, abnormal bleeding, enlarged lymph nodes, and angioedema.     Objective:   Physical  Exam    WD, WN, 63 y/o BF in NAD... GENERAL:  Alert & oriented; pleasant & cooperative... HEENT:  [place]/AT, EOM-wnl, PERRLA, EACs-clear, TMs-wnl, NOSE-clear, THROAT-clear & wnl. NECK:  Supple w/ full ROM; no JVD; normal carotid impulses w/o bruits; no thyromegaly or nodules palpated; no lymphadenopathy. CHEST:  Clear to P & A; without wheezes/ rales/ or rhonchi. HEART:  Regular Rhythm; without murmurs/ rubs/ or gallops. ABDOMEN:  Soft & nontender; normal bowel sounds; no organomegaly or masses detected. EXT: without deformities, mild arthritic changes; no varicose veins/ +venous insuffic/ no edema. NEURO:  CN's intact; motor testing normal; sensory testing normal; gait normal & balance OK. DERM:  No lesions noted; no rash etc...  RADIOLOGY DATA:  Reviewed in the EPIC EMR & discussed w/ the patient...   LABORATORY DATA:  Reviewed in the EPIC EMR & discussed w/ the patient...     Assessment:     CPX>> Given TDAP 5/15...  HBP>  Remains well regulated on Lotrel & Maxzide; labs look good; continue same...  Ven Insuffic>  Amy Haynes knows to elim salt, elev legs, wear support hose, & Amy Haynes is on the diuretic...  Chol>  FLP at goal on Simva20, diet, exercise, etc...  Divertics>  Last colon 11/07 by DrPerry w/ divertics only & f/u planned 6yr...  Anxiety>  Now reired 7 working parttime w/ decr in stress...  Hx borderline anemia w/ microcytosis>  Hg actually normal w/ small cells; Fe has been ok & GI work up neg; we continue to follow...     Plan:     Patient's Medications  New Prescriptions   No medications on file  Previous Medications   CHOLECALCIFEROL (VITAMIN D) 1000 UNITS TABLET    Take 1,000 Units by mouth daily.   MULTIPLE VITAMINS-MINERALS (MULTIVITAMIN & MINERAL PO)    Take 1 tablet by mouth daily.  Modified Medications   Modified Medication Previous Medication   AMLODIPINE-BENAZEPRIL (  LOTREL) 10-20 MG PER CAPSULE amLODipine-benazepril (LOTREL) 10-20 MG per capsule      take 1  capsule by mouth once daily    take 1 capsule by mouth once daily   SIMVASTATIN (ZOCOR) 20 MG TABLET simvastatin (ZOCOR) 20 MG tablet      take 1 tablet by mouth every evening    take 1 tablet by mouth every evening   TRIAMTERENE-HYDROCHLOROTHIAZIDE (MAXZIDE-25) 37.5-25 MG PER TABLET triamterene-hydrochlorothiazide (MAXZIDE-25) 37.5-25 MG per tablet      take 1 tablet by mouth once daily    take 1 tablet by mouth once daily  Discontinued Medications   No medications on file

## 2014-02-10 ENCOUNTER — Ambulatory Visit (INDEPENDENT_AMBULATORY_CARE_PROVIDER_SITE_OTHER)
Admission: RE | Admit: 2014-02-10 | Discharge: 2014-02-10 | Disposition: A | Discharge status: Home / Self Care | Payer: BC Managed Care – PPO | Source: Ambulatory Visit | Attending: Pulmonary Disease | Admitting: Pulmonary Disease

## 2014-02-10 DIAGNOSIS — Z0000 Encounter for general adult medical examination without abnormal findings: Secondary | ICD-10-CM

## 2014-02-10 DIAGNOSIS — Z13820 Encounter for screening for osteoporosis: Secondary | ICD-10-CM

## 2014-02-10 LAB — VITAMIN D 25 HYDROXY (VIT D DEFICIENCY, FRACTURES): VIT D 25 HYDROXY: 42 ng/mL (ref 30–89)

## 2014-08-17 ENCOUNTER — Other Ambulatory Visit (INDEPENDENT_AMBULATORY_CARE_PROVIDER_SITE_OTHER): Payer: BC Managed Care – PPO

## 2014-08-17 ENCOUNTER — Ambulatory Visit (INDEPENDENT_AMBULATORY_CARE_PROVIDER_SITE_OTHER): Payer: BC Managed Care – PPO | Admitting: Family

## 2014-08-17 ENCOUNTER — Encounter: Payer: Self-pay | Admitting: Family

## 2014-08-17 VITALS — BP 130/86 | HR 54 | Temp 98.4°F | Resp 18 | Ht 63.0 in | Wt 173.0 lb

## 2014-08-17 DIAGNOSIS — D509 Iron deficiency anemia, unspecified: Secondary | ICD-10-CM | POA: Diagnosis not present

## 2014-08-17 DIAGNOSIS — R718 Other abnormality of red blood cells: Secondary | ICD-10-CM | POA: Diagnosis not present

## 2014-08-17 LAB — CBC
HCT: 44.3 % (ref 36.0–46.0)
Hemoglobin: 14.1 g/dL (ref 12.0–15.0)
MCHC: 31.9 g/dL (ref 30.0–36.0)
MCV: 81.8 fl (ref 78.0–100.0)
Platelets: 206 10*3/uL (ref 150.0–400.0)
RBC: 5.42 Mil/uL — ABNORMAL HIGH (ref 3.87–5.11)
RDW: 14.7 % (ref 11.5–15.5)
WBC: 4.8 10*3/uL (ref 4.0–10.5)

## 2014-08-17 LAB — IRON AND TIBC
%SAT: 32 % (ref 20–55)
Iron: 106 ug/dL (ref 42–145)
TIBC: 327 ug/dL (ref 250–470)
UIBC: 221 ug/dL (ref 125–400)

## 2014-08-17 LAB — IRON: Iron: 101 ug/dL (ref 42–145)

## 2014-08-17 LAB — FERRITIN: Ferritin: 121.8 ng/mL (ref 10.0–291.0)

## 2014-08-17 NOTE — Progress Notes (Signed)
   Subjective:    Patient ID: Amy Haynes, female    DOB: August 13, 1951, 63 y.o.   MRN: [id number]  Chief Complaint  Patient presents with  . Follow-up    was iron defficient, wants to see if levels improved after taking iron supplement   HPI:  Amy Haynes is a 63 y.o. female who presents today for follow up of her iron deficiency anemia.   Currently taking OTC ferrous sulfate since May.  Previously had an MCV of 81.3 and a low iron saturation ration of 19.9. Denies fatigue or shortness and had never experienced any shortness of breath or fatigue.  No Known Allergies  Current Outpatient Prescriptions on File Prior to Visit  Medication Sig Dispense Refill  . amLODipine-benazepril (LOTREL) 10-20 MG per capsule take 1 capsule by mouth once daily 30 capsule 11  . cholecalciferol (VITAMIN D) 1000 UNITS tablet Take 1,000 Units by mouth daily.    . Multiple Vitamins-Minerals (MULTIVITAMIN & MINERAL PO) Take 1 tablet by mouth daily.    . simvastatin (ZOCOR) 20 MG tablet take 1 tablet by mouth every evening 30 tablet 11  . triamterene-hydrochlorothiazide (MAXZIDE-25) 37.5-25 MG per tablet take 1 tablet by mouth once daily 30 tablet 11   No current facility-administered medications on file prior to visit.    Review of Systems    See HPI  Objective:    BP 130/86 mmHg  Pulse 54  Temp(Src) 98.4 F (36.9 C) (Oral)  Resp 18  Ht 5\' 3"  (1.6 m)  Wt 173 lb (78.472 kg)  BMI 30.65 kg/m2  SpO2 97% Nursing note and vital signs reviewed.  Physical Exam  Constitutional: She is oriented to person, place, and time. She appears well-developed and well-nourished. No distress.  Cardiovascular: Normal rate, regular rhythm, normal heart sounds and intact distal pulses.   Pulmonary/Chest: Effort normal and breath sounds normal.  Neurological: She is alert and oriented to person, place, and time.  Skin: Skin is warm and dry.  Psychiatric: She has a normal mood and affect. Her behavior is normal.  Judgment and thought content normal.      Assessment & Plan:

## 2014-08-17 NOTE — Patient Instructions (Signed)
Thank you for choosing [facility].  Summary/Instructions:  Please stop by the lab on the basement level of the building for your blood work. Your results will be released to MyChart (or called to you) after review, usually within 72 hours after test completion. If any changes need to be made, you will be notified at that same time.  If your symptoms worsen or fail to improve, please contact our office for further instruction, or in case of emergency go directly to the emergency room at the closest medical facility.     

## 2014-08-17 NOTE — Assessment & Plan Note (Signed)
Currently stable with no symptoms. Recheck CBC, Ferritin, TIBC and Iron levels. Continue ferrous sulfate pending lab results.

## 2014-08-17 NOTE — Progress Notes (Signed)
Pre visit review using our clinic review tool, if applicable. No additional management support is needed unless otherwise documented below in the visit note. 

## 2014-10-30 ENCOUNTER — Encounter: Payer: Self-pay | Admitting: Internal Medicine

## 2014-10-30 ENCOUNTER — Ambulatory Visit (INDEPENDENT_AMBULATORY_CARE_PROVIDER_SITE_OTHER): Payer: BC Managed Care – PPO | Admitting: Internal Medicine

## 2014-10-30 VITALS — BP 118/80 | HR 61 | Temp 98.2°F | Ht 63.0 in | Wt 175.0 lb

## 2014-10-30 DIAGNOSIS — I10 Essential (primary) hypertension: Secondary | ICD-10-CM | POA: Diagnosis not present

## 2014-10-30 DIAGNOSIS — R718 Other abnormality of red blood cells: Secondary | ICD-10-CM

## 2014-10-30 DIAGNOSIS — Z1211 Encounter for screening for malignant neoplasm of colon: Secondary | ICD-10-CM | POA: Diagnosis not present

## 2014-10-30 NOTE — Assessment & Plan Note (Signed)
BP well controlled today, continue benazepril/amlodipine and maxzide. Will check BMP at next visit.

## 2014-10-30 NOTE — Progress Notes (Signed)
Pre visit review using our clinic review tool, if applicable. No additional management support is needed unless otherwise documented below in the visit note. 

## 2014-10-30 NOTE — Patient Instructions (Signed)
We will send you back to the GI doctor to get the next screening colonoscopy to make sure there are no problems since last time.   We will see you back late May or June for the physical. If you need any refills before then please just call our office and we will take care of it.   Exercise to Stay Healthy Exercise helps you become and stay healthy. EXERCISE IDEAS AND TIPS Choose exercises that:  You enjoy.  Fit into your day. You do not need to exercise really hard to be healthy. You can do exercises at a slow or medium level and stay healthy. You can:  Stretch before and after working out.  Try yoga, Pilates, or tai chi.  Lift weights.  Walk fast, swim, jog, run, climb stairs, bicycle, dance, or rollerskate.  Take aerobic classes. Exercises that burn about 150 calories:  Running 1  miles in 15 minutes.  Playing volleyball for 45 to 60 minutes.  Washing and waxing a car for 45 to 60 minutes.  Playing touch football for 45 minutes.  Walking 1  miles in 35 minutes.  Pushing a stroller 1  miles in 30 minutes.  Playing basketball for 30 minutes.  Raking leaves for 30 minutes.  Bicycling 5 miles in 30 minutes.  Walking 2 miles in 30 minutes.  Dancing for 30 minutes.  Shoveling snow for 15 minutes.  Swimming laps for 20 minutes.  Walking up stairs for 15 minutes.  Bicycling 4 miles in 15 minutes.  Gardening for 30 to 45 minutes.  Jumping rope for 15 minutes.  Washing windows or floors for 45 to 60 minutes. Document Released: 10/14/2010 Document Revised: 12/04/2011 Document Reviewed: 10/14/2010 [facility] Patient Information 2015 [place], [place]. This information is not intended to replace advice given to you by your health care provider. Make sure you discuss any questions you have with your health care provider.

## 2014-10-30 NOTE — Assessment & Plan Note (Signed)
She can stop taking iron pills as her iron levels are back to normal. No overt bleeding, referral to GI for repeat colonoscopy (she thinks she is due anyway).

## 2014-10-30 NOTE — Progress Notes (Signed)
   Subjective:    Patient ID: Amy Haynes, female    DOB: 05-03-1951, 64 y.o.   MRN: [id number]  HPI The patient is a 64 YO female who is coming in for follow up of her blood pressure. She is switching doctors and needs to get her medicines refilled. She has had high blood pressure for many years and her medicines have been stable for the last several years. She denies any new side effects from medicines. She denies associated headaches, chest pains, nausea. She is well controlled and not bothered by it most times. She thinks she is due for a colon cancer screening.   PMH, [place], social history, PSH, allergies, medications, problem list reviewed and updated.   Review of Systems  Constitutional: Negative for fever, activity change, appetite change, fatigue and unexpected weight change.  HENT: Negative.   Eyes: Negative.   Respiratory: Positive for cough. Negative for shortness of breath and wheezing.   Cardiovascular: Negative for chest pain, palpitations and leg swelling.  Gastrointestinal: Negative for abdominal pain, diarrhea, constipation and blood in stool.  Musculoskeletal: Negative.   Skin: Negative.   Neurological: Negative.   Psychiatric/Behavioral: Negative.       Objective:   Physical Exam  Constitutional: She is oriented to person, place, and time. She appears well-developed and well-nourished.  HENT:  Head: Normocephalic and atraumatic.  Eyes: EOM are normal.  Neck: Normal range of motion.  Cardiovascular: Normal rate and regular rhythm.   Pulmonary/Chest: Effort normal and breath sounds normal. No respiratory distress. She has no wheezes. She has no rales.  Abdominal: Soft. Bowel sounds are normal.  Musculoskeletal: She exhibits no edema.  Neurological: She is alert and oriented to person, place, and time. Coordination normal.  Skin: Skin is warm and dry.  Psychiatric: She has a normal mood and affect. Her behavior is normal.   Filed Vitals:   10/30/14 1416  BP:  118/80  Pulse: 61  Temp: 98.2 F (36.8 C)  TempSrc: Oral  Height: 5\' 3"  (1.6 m)  Weight: 175 lb (79.379 kg)  SpO2: 99%      Assessment & Plan:

## 2014-11-16 ENCOUNTER — Other Ambulatory Visit (HOSPITAL_COMMUNITY): Payer: Self-pay | Admitting: Obstetrics & Gynecology

## 2014-11-16 DIAGNOSIS — Z1231 Encounter for screening mammogram for malignant neoplasm of breast: Secondary | ICD-10-CM

## 2014-12-10 ENCOUNTER — Ambulatory Visit (HOSPITAL_COMMUNITY)
Admission: RE | Admit: 2014-12-10 | Discharge: 2014-12-10 | Disposition: A | Discharge status: Home / Self Care | Payer: BC Managed Care – PPO | Source: Ambulatory Visit | Attending: Obstetrics & Gynecology | Admitting: Obstetrics & Gynecology

## 2014-12-10 DIAGNOSIS — Z1231 Encounter for screening mammogram for malignant neoplasm of breast: Secondary | ICD-10-CM | POA: Insufficient documentation

## 2015-02-17 ENCOUNTER — Other Ambulatory Visit: Payer: Self-pay | Admitting: Pulmonary Disease

## 2015-02-23 ENCOUNTER — Other Ambulatory Visit: Payer: Self-pay | Admitting: Geriatric Medicine

## 2015-02-23 ENCOUNTER — Telehealth: Payer: Self-pay | Admitting: Internal Medicine

## 2015-02-23 MED ORDER — TRIAMTERENE-HCTZ 37.5-25 MG PO TABS: ORAL_TABLET | ORAL | Status: DC

## 2015-02-23 MED ORDER — SIMVASTATIN 20 MG PO TABS: ORAL_TABLET | ORAL | Status: DC

## 2015-02-23 MED ORDER — AMLODIPINE BESY-BENAZEPRIL HCL 10-20 MG PO CAPS: ORAL_CAPSULE | ORAL | Status: DC

## 2015-02-23 NOTE — Telephone Encounter (Signed)
Patient states she had requested three scripts for refills.  She does not remember the names.  States she went to pharmacy to pick up and they stated she needed to have an appointment.  Patient states she has an appointment scheduled for 6/27.  She is requesting meds to get her through until that appointment time.  She is requesting a call in regards.

## 2015-02-23 NOTE — Telephone Encounter (Signed)
Sent to pharmacy 

## 2015-03-22 ENCOUNTER — Other Ambulatory Visit (INDEPENDENT_AMBULATORY_CARE_PROVIDER_SITE_OTHER): Payer: BC Managed Care – PPO

## 2015-03-22 ENCOUNTER — Ambulatory Visit (INDEPENDENT_AMBULATORY_CARE_PROVIDER_SITE_OTHER): Payer: BC Managed Care – PPO | Admitting: Internal Medicine

## 2015-03-22 ENCOUNTER — Encounter: Payer: Self-pay | Admitting: Internal Medicine

## 2015-03-22 VITALS — BP 122/66 | HR 61 | Temp 99.0°F | Resp 12 | Ht 63.0 in | Wt 174.8 lb

## 2015-03-22 DIAGNOSIS — Z0000 Encounter for general adult medical examination without abnormal findings: Secondary | ICD-10-CM

## 2015-03-22 DIAGNOSIS — I10 Essential (primary) hypertension: Secondary | ICD-10-CM

## 2015-03-22 DIAGNOSIS — E7800 Pure hypercholesterolemia, unspecified: Secondary | ICD-10-CM

## 2015-03-22 LAB — LIPID PANEL
Cholesterol: 150 mg/dL (ref 0–200)
HDL: 64 mg/dL (ref >39.00)
LDL Cholesterol: 75 mg/dL (ref 0–99)
NONHDL: 86
Total CHOL/HDL Ratio: 2
Triglycerides: 54 mg/dL (ref 0.0–149.0)
VLDL: 10.8 mg/dL (ref 0.0–40.0)

## 2015-03-22 LAB — COMPREHENSIVE METABOLIC PANEL
ALBUMIN: 4.2 g/dL (ref 3.5–5.2)
ALK PHOS: 70 U/L (ref 39–117)
ALT: 27 U/L (ref 0–35)
AST: 23 U/L (ref 0–37)
BILIRUBIN TOTAL: 0.5 mg/dL (ref 0.2–1.2)
BUN: 16 mg/dL (ref 6–23)
CO2: 32 mEq/L (ref 19–32)
Calcium: 9.9 mg/dL (ref 8.4–10.5)
Chloride: 104 mEq/L (ref 96–112)
Creatinine, Ser: 0.8 mg/dL (ref 0.40–1.20)
GFR: 93.13 mL/min (ref >60.00)
Glucose, Bld: 92 mg/dL (ref 70–99)
POTASSIUM: 3.9 meq/L (ref 3.5–5.1)
Sodium: 141 mEq/L (ref 135–145)
TOTAL PROTEIN: 8.2 g/dL (ref 6.0–8.3)

## 2015-03-22 LAB — CBC
HEMATOCRIT: 42.1 % (ref 36.0–46.0)
Hemoglobin: 13.9 g/dL (ref 12.0–15.0)
MCHC: 32.9 g/dL (ref 30.0–36.0)
MCV: 80.5 fl (ref 78.0–100.0)
Platelets: 172 10*3/uL (ref 150.0–400.0)
RBC: 5.24 Mil/uL — AB (ref 3.87–5.11)
RDW: 14.1 % (ref 11.5–15.5)
WBC: 3.1 10*3/uL — AB (ref 4.0–10.5)

## 2015-03-22 MED ORDER — AMLODIPINE BESY-BENAZEPRIL HCL 10-20 MG PO CAPS: ORAL_CAPSULE | ORAL | Status: DC

## 2015-03-22 MED ORDER — SIMVASTATIN 20 MG PO TABS: ORAL_TABLET | ORAL | Status: DC

## 2015-03-22 MED ORDER — TRIAMTERENE-HCTZ 37.5-25 MG PO TABS: ORAL_TABLET | ORAL | Status: DC

## 2015-03-22 NOTE — Assessment & Plan Note (Signed)
Checking lipid panel and LFTs today. On simvastatin 20 mg daily, adjust as needed.

## 2015-03-22 NOTE — Assessment & Plan Note (Signed)
BP at goal on amlodipine/benazepril and hctz. Checking BMP today for complications and adjust as needed.

## 2015-03-22 NOTE — Progress Notes (Signed)
Pre visit review using our clinic review tool, if applicable. No additional management support is needed unless otherwise documented below in the visit note. 

## 2015-03-22 NOTE — Patient Instructions (Signed)
We have sent in your refills and will check the blood work today.   You are doing great! Think about getting the shingles shot before you turn 65. This is a booster shot for the chicken pox virus and protects you for the rest of your life. Medicare does not pay for it and we recommend if you want it get it before 41.   Come back in about 6-12 months for a check up or sooner if you are having problems.   Health Maintenance Adopting a healthy lifestyle and getting preventive care can go a long way to promote health and wellness. Talk with your health care provider about what schedule of regular examinations is right for you. This is a good chance for you to check in with your provider about disease prevention and staying healthy. In between checkups, there are plenty of things you can do on your own. Experts have done a lot of research about which lifestyle changes and preventive measures are most likely to keep you healthy. Ask your health care provider for more information. WEIGHT AND DIET  Eat a healthy diet  Be sure to include plenty of vegetables, fruits, low-fat dairy products, and lean protein.  Do not eat a lot of foods high in solid fats, added sugars, or salt.  Get regular exercise. This is one of the most important things you can do for your health.  Most adults should exercise for at least 150 minutes each week. The exercise should increase your heart rate and make you sweat (moderate-intensity exercise).  Most adults should also do strengthening exercises at least twice a week. This is in addition to the moderate-intensity exercise.  Maintain a healthy weight  Body mass index (BMI) is a measurement that can be used to identify possible weight problems. It estimates body fat based on height and weight. Your health care provider can help determine your BMI and help you achieve or maintain a healthy weight.  For females 46 years of age and older:   A BMI below 18.5 is considered  underweight.  A BMI of 18.5 to 24.9 is normal.  A BMI of 25 to 29.9 is considered overweight.  A BMI of 30 and above is considered obese.  Watch levels of cholesterol and blood lipids  You should start having your blood tested for lipids and cholesterol at 63 years of age, then have this test every 5 years.  You may need to have your cholesterol levels checked more often if:  Your lipid or cholesterol levels are high.  You are older than 62 years of age.  You are at high risk for heart disease.  CANCER SCREENING   Lung Cancer  Lung cancer screening is recommended for adults 108-33 years old who are at high risk for lung cancer because of a history of smoking.  A yearly low-dose CT scan of the lungs is recommended for people who:  Currently smoke.  Have quit within the past 15 years.  Have at least a 30-pack-year history of smoking. A pack year is smoking an average of one pack of cigarettes a day for 1 year.  Yearly screening should continue until it has been 15 years since you quit.  Yearly screening should stop if you develop a health problem that would prevent you from having lung cancer treatment.  Breast Cancer  Practice breast self-awareness. This means understanding how your breasts normally appear and feel.  It also means doing regular breast self-exams. Let your health  care provider know about any changes, no matter how small.  If you are in your 20s or 30s, you should have a clinical breast exam (CBE) by a health care provider every 1-3 years as part of a regular health exam.  If you are 83 or older, have a CBE every year. Also consider having a breast X-ray (mammogram) every year.  If you have a family history of breast cancer, talk to your health care provider about genetic screening.  If you are at high risk for breast cancer, talk to your health care provider about having an MRI and a mammogram every year.  Breast cancer gene (BRCA) assessment is  recommended for women who have family members with BRCA-related cancers. BRCA-related cancers include:  Breast.  Ovarian.  Tubal.  Peritoneal cancers.  Results of the assessment will determine the need for genetic counseling and BRCA1 and BRCA2 testing. Cervical Cancer Routine pelvic examinations to screen for cervical cancer are no longer recommended for nonpregnant women who are considered low risk for cancer of the pelvic organs (ovaries, uterus, and vagina) and who do not have symptoms. A pelvic examination may be necessary if you have symptoms including those associated with pelvic infections. Ask your health care provider if a screening pelvic exam is right for you.   The Pap test is the screening test for cervical cancer for women who are considered at risk.  If you had a hysterectomy for a problem that was not cancer or a condition that could lead to cancer, then you no longer need Pap tests.  If you are older than 65 years, and you have had normal Pap tests for the past 10 years, you no longer need to have Pap tests.  If you have had past treatment for cervical cancer or a condition that could lead to cancer, you need Pap tests and screening for cancer for at least 20 years after your treatment.  If you no longer get a Pap test, assess your risk factors if they change (such as having a new sexual partner). This can affect whether you should start being screened again.  Some women have medical problems that increase their chance of getting cervical cancer. If this is the case for you, your health care provider may recommend more frequent screening and Pap tests.  The human papillomavirus (HPV) test is another test that may be used for cervical cancer screening. The HPV test looks for the virus that can cause cell changes in the cervix. The cells collected during the Pap test can be tested for HPV.  The HPV test can be used to screen women 7 years of age and older. Getting tested  for HPV can extend the interval between normal Pap tests from three to five years.  An HPV test also should be used to screen women of any age who have unclear Pap test results.  After 63 years of age, women should have HPV testing as often as Pap tests.  Colorectal Cancer  This type of cancer can be detected and often prevented.  Routine colorectal cancer screening usually begins at 63 years of age and continues through 63 years of age.  Your health care provider may recommend screening at an earlier age if you have risk factors for colon cancer.  Your health care provider may also recommend using home test kits to check for hidden blood in the stool.  A small camera at the end of a tube can be used to examine your  colon directly (sigmoidoscopy or colonoscopy). This is done to check for the earliest forms of colorectal cancer.  Routine screening usually begins at age 53.  Direct examination of the colon should be repeated every 5-10 years through 63 years of age. However, you may need to be screened more often if early forms of precancerous polyps or small growths are found. Skin Cancer  Check your skin from head to toe regularly.  Tell your health care provider about any new moles or changes in moles, especially if there is a change in a mole's shape or color.  Also tell your health care provider if you have a mole that is larger than the size of a pencil eraser.  Always use sunscreen. Apply sunscreen liberally and repeatedly throughout the day.  Protect yourself by wearing long sleeves, pants, a wide-brimmed hat, and sunglasses whenever you are outside. HEART DISEASE, DIABETES, AND HIGH BLOOD PRESSURE   Have your blood pressure checked at least every 1-2 years. High blood pressure causes heart disease and increases the risk of stroke.  If you are between 33 years and 89 years old, ask your health care provider if you should take aspirin to prevent strokes.  Have regular  diabetes screenings. This involves taking a blood sample to check your fasting blood sugar level.  If you are at a normal weight and have a low risk for diabetes, have this test once every three years after 63 years of age.  If you are overweight and have a high risk for diabetes, consider being tested at a younger age or more often. PREVENTING INFECTION  Hepatitis B  If you have a higher risk for hepatitis B, you should be screened for this virus. You are considered at high risk for hepatitis B if:  You were born in a country where hepatitis B is common. Ask your health care provider which countries are considered high risk.  Your parents were born in a high-risk country, and you have not been immunized against hepatitis B (hepatitis B vaccine).  You have HIV or AIDS.  You use needles to inject street drugs.  You live with someone who has hepatitis B.  You have had sex with someone who has hepatitis B.  You get hemodialysis treatment.  You take certain medicines for conditions, including cancer, organ transplantation, and autoimmune conditions. Hepatitis C  Blood testing is recommended for:  Everyone born from [birth year] through 1965.  Anyone with known risk factors for hepatitis C. Sexually transmitted infections (STIs)  You should be screened for sexually transmitted infections (STIs) including gonorrhea and chlamydia if:  You are sexually active and are younger than 63 years of age.  You are older than 63 years of age and your health care provider tells you that you are at risk for this type of infection.  Your sexual activity has changed since you were last screened and you are at an increased risk for chlamydia or gonorrhea. Ask your health care provider if you are at risk.  If you do not have HIV, but are at risk, it may be recommended that you take a prescription medicine daily to prevent HIV infection. This is called pre-exposure prophylaxis (PrEP). You are considered at  risk if:  You are sexually active and do not regularly use condoms or know the HIV status of your partner(s).  You take drugs by injection.  You are sexually active with a partner who has HIV. Talk with your health care provider about whether you  are at high risk of being infected with HIV. If you choose to begin PrEP, you should first be tested for HIV. You should then be tested every 3 months for as long as you are taking PrEP.  PREGNANCY   If you are premenopausal and you may become pregnant, ask your health care provider about preconception counseling.  If you may become pregnant, take 400 to 800 micrograms (mcg) of folic acid every day.  If you want to prevent pregnancy, talk to your health care provider about birth control (contraception). OSTEOPOROSIS AND MENOPAUSE   Osteoporosis is a disease in which the bones lose minerals and strength with aging. This can result in serious bone fractures. Your risk for osteoporosis can be identified using a bone density scan.  If you are 41 years of age or older, or if you are at risk for osteoporosis and fractures, ask your health care provider if you should be screened.  Ask your health care provider whether you should take a calcium or vitamin D supplement to lower your risk for osteoporosis.  Menopause may have certain physical symptoms and risks.  Hormone replacement therapy may reduce some of these symptoms and risks. Talk to your health care provider about whether hormone replacement therapy is right for you.  HOME CARE INSTRUCTIONS   Schedule regular health, dental, and eye exams.  Stay current with your immunizations.   Do not use any tobacco products including cigarettes, chewing tobacco, or electronic cigarettes.  If you are pregnant, do not drink alcohol.  If you are breastfeeding, limit how much and how often you drink alcohol.  Limit alcohol intake to no more than 1 drink per day for nonpregnant women. One drink equals  12 ounces of beer, 5 ounces of wine, or 1 ounces of hard liquor.  Do not use street drugs.  Do not share needles.  Ask your health care provider for help if you need support or information about quitting drugs.  Tell your health care provider if you often feel depressed.  Tell your health care provider if you have ever been abused or do not feel safe at home. Document Released: 03/27/2011 Document Revised: 01/26/2014 Document Reviewed: 08/13/2013 [facility] Patient Information 2015 [place], [place]. This information is not intended to replace advice given to you by your health care provider. Make sure you discuss any questions you have with your health care provider.

## 2015-03-22 NOTE — Assessment & Plan Note (Signed)
Due for repeat colonoscopy next year, talked to her about shingles shot and she would like to get but not today. Gets flu shot yearly. Exercises regularly. Non-smoker. Wellness recommendations given to her.

## 2015-03-22 NOTE — Progress Notes (Signed)
   Subjective:    Patient ID: Amy Haynes, female    DOB: [birth year], 64 y.o.   MRN: [id number]  HPI The patient is a 64 YO female who is coming in for wellness. No new complaints. Exercises with walking 3-5 times per week.   PMH, [facility], social history reviewed and updated.   Review of Systems  Constitutional: Negative for fever, activity change, appetite change, fatigue and unexpected weight change.  HENT: Negative.   Eyes: Negative.   Respiratory: Negative for shortness of breath and wheezing.   Cardiovascular: Negative for chest pain, palpitations and leg swelling.  Gastrointestinal: Negative for abdominal pain, diarrhea, constipation and blood in stool.  Musculoskeletal: Negative.   Skin: Negative.   Neurological: Negative.   Psychiatric/Behavioral: Negative.       Objective:   Physical Exam  Constitutional: She is oriented to person, place, and time. She appears well-developed and well-nourished.  HENT:  Head: Normocephalic and atraumatic.  Eyes: EOM are normal.  Neck: Normal range of motion.  Cardiovascular: Normal rate and regular rhythm.   Carotids without bruits.   Pulmonary/Chest: Effort normal and breath sounds normal. No respiratory distress. She has no wheezes. She has no rales.  Abdominal: Soft. Bowel sounds are normal.  Musculoskeletal: She exhibits no edema.  Neurological: She is alert and oriented to person, place, and time. Coordination normal.  Skin: Skin is warm and dry.  Psychiatric: She has a normal mood and affect.   Filed Vitals:   03/22/15 0936  BP: 122/66  Pulse: 61  Temp: 99 F (37.2 C)  TempSrc: Oral  Resp: 12  Height: 5\' 3"  (1.6 m)  Weight: 174 lb 12.8 oz (79.289 kg)  SpO2: 97%      Assessment & Plan:

## 2015-05-17 ENCOUNTER — Ambulatory Visit (INDEPENDENT_AMBULATORY_CARE_PROVIDER_SITE_OTHER): Payer: BC Managed Care – PPO | Admitting: Adult Health

## 2015-05-17 ENCOUNTER — Encounter: Payer: Self-pay | Admitting: Adult Health

## 2015-05-17 VITALS — BP 118/80 | Temp 98.8°F | Ht 63.0 in | Wt 176.2 lb

## 2015-05-17 DIAGNOSIS — B309 Viral conjunctivitis, unspecified: Secondary | ICD-10-CM | POA: Diagnosis not present

## 2015-05-17 NOTE — Patient Instructions (Addendum)
It was great meeting you today and I am sorry you are not feeling too well.   Your symptoms are consistent with viral conjunctivitis. Unfortunantly, antibiotics will not help. For symptoms relief you can use OTC antihistamine eye drops.   You can try Zaditor, Visine- A, or Naphcon A eye drops.   This should pass in 36-48 hours.   Please let  Me know if you need anything    Viral Conjunctivitis Conjunctivitis is an irritation (inflammation) of the clear membrane that covers the white part of the eye (the conjunctiva). The irritation can also happen on the underside of the eyelids. Conjunctivitis makes the eye red or pink in color. This is what is commonly known as pink eye. Viral conjunctivitis can spread easily (contagious). CAUSES   Infection from virus on the surface of the eye.  Infection from the irritation or injury of nearby tissues such as the eyelids or cornea.  More serious inflammation or infection on the inside of the eye.  Other eye diseases.  The use of certain eye medications. SYMPTOMS  The normally white color of the eye or the underside of the eyelid is usually pink or red in color. The pink eye is usually associated with irritation, tearing and some sensitivity to light. Viral conjunctivitis is often associated with a clear, watery discharge. If a discharge is present, there may also be some blurred vision in the affected eye. DIAGNOSIS  Conjunctivitis is diagnosed by an eye exam. The eye specialist looks for changes in the surface tissues of the eye which take on changes characteristic of the specific types of conjunctivitis. A sample of any discharge may be collected on a Q-Tip (sterile swap). The sample will be sent to a lab to see whether or not the inflammation is caused by bacterial or viral infection. TREATMENT  Viral conjunctivitis will not respond to medicines that kill germs (antibiotics). Treatment is aimed at stopping a bacterial infection on top of the  viral infection. The goal of treatment is to relieve symptoms (such as itching) with antihistamine drops or other eye medications.  HOME CARE INSTRUCTIONS   To ease discomfort, apply a cool, clean wash cloth to your eye for 10 to 20 minutes, 3 to 4 times a day.  Gently wipe away any drainage from the eye with a warm, wet washcloth or a cotton ball.  Wash your hands often with soap and use paper towels to dry.  Do not share towels or washcloths. This may spread the infection.  Change or wash your pillowcase every day.  You should not use eye make-up until the infection is gone.  Stop using contacts lenses. Ask your eye professional how to sterilize or replace them before using again. This depends on the type of contact lenses used.  Do not touch the edge of the eyelid with the eye drop bottle or ointment tube when applying medications to the affected eye. This will stop you from spreading the infection to the other eye or to others. SEEK IMMEDIATE MEDICAL CARE IF:   The infection has not improved within 3 days of beginning treatment.  A watery discharge from the eye develops.  Pain in the eye increases.  The redness is spreading.  Vision becomes blurred.  An oral temperature above 102 F (38.9 C) develops, or as your caregiver suggests.  Facial pain, redness or swelling develops.  Any problems that may be related to the prescribed medicine develop. MAKE SURE YOU:   Understand these instructions.  Will watch your condition.  Will get help right away if you are not doing well or get worse. Document Released: 09/11/2005 Document Revised: 12/04/2011 Document Reviewed: 04/30/2008 [facility] Patient Information 2015 [place], [place]. This information is not intended to replace advice given to you by your health care provider. Make sure you discuss any questions you have with your health care provider.

## 2015-05-17 NOTE — Progress Notes (Signed)
Pre visit review using our clinic review tool, if applicable. No additional management support is needed unless otherwise documented below in the visit note. 

## 2015-05-17 NOTE — Progress Notes (Signed)
Subjective:    Patient ID: Amy Haynes, female    DOB: 10-24-1950, 64 y.o.   MRN: [id number]  HPI  64 year old female who presents to the office for conjunctivitis. Her granddaughter was diagnosed with "pink eye" on Thursday. This morning Amy Haynes woke up with itching, watering, and pain that feels like there is " sand in my right eye". She denies any matting of the eye, purulent discharge, or blurred vision. She has not tried anything over the counter for symptom relief.   Review of Systems  Constitutional: Negative.   Eyes: Positive for pain, discharge (watery), redness and itching. Negative for photophobia and visual disturbance.  Skin: Negative.   All other systems reviewed and are negative.  Past Medical History  Diagnosis Date  . History of bronchitis   . Hypertension   . Venous insufficiency   . Hypercholesteremia   . Diverticulosis of colon   . Anxiety   . Microcytosis     Social History   Social History  . Marital Status: Single    Spouse Name: N/A  . Number of Children: 2  . Years of Education: N/A   Occupational History  . Not on file.   Social History Main Topics  . Smoking status: Never Smoker   . Smokeless tobacco: Not on file  . Alcohol Use: Yes     Comment: social use  . Drug Use: Not on file  . Sexual Activity: Not on file   Other Topics Concern  . Not on file   Social History Narrative    Past Surgical History  Procedure Laterality Date  . Foot surgery  1991    Family History  Problem Relation Age of Onset  . Arthritis Mother   . Hypertension Mother   . Kidney disease Sister   . Diabetes Sister     No Known Allergies  Current Outpatient Prescriptions on File Prior to Visit  Medication Sig Dispense Refill  . amLODipine-benazepril (LOTREL) 10-20 MG per capsule take 1 capsule by mouth once daily 30 capsule 11  . cholecalciferol (VITAMIN D) 1000 UNITS tablet Take 1,000 Units by mouth daily.    . Multiple Vitamins-Minerals  (MULTIVITAMIN & MINERAL PO) Take 1 tablet by mouth daily.    . simvastatin (ZOCOR) 20 MG tablet take 1 tablet by mouth every evening 30 tablet 11  . triamterene-hydrochlorothiazide (MAXZIDE-25) 37.5-25 MG per tablet take 1 tablet by mouth once daily 30 tablet 11   No current facility-administered medications on file prior to visit.    BP 118/80 mmHg  Temp(Src) 98.8 F (37.1 C) (Oral)  Ht 5\' 3"  (1.6 m)  Wt 176 lb 3.2 oz (79.924 kg)  BMI 31.22 kg/m2       Objective:   Physical Exam  Constitutional: She is oriented to person, place, and time. She appears well-developed and well-nourished. No distress.  HENT:  Head: Normocephalic and atraumatic.  Right Ear: External ear normal.  Left Ear: External ear normal.  Nose: Nose normal.  Mouth/Throat: Oropharynx is clear and moist. No oropharyngeal exudate.  Eyes: Pupils are equal, round, and reactive to light. Right eye exhibits no discharge. Left eye exhibits no discharge.  Slight conjunctiva injection in right eye.   Neurological: She is alert and oriented to person, place, and time.  Skin: Skin is warm and dry. No rash noted. She is not diaphoretic. No erythema. No pallor.  Psychiatric: She has a normal mood and affect. Her behavior is normal. Judgment and thought content  normal.  Nursing note and vitals reviewed.     Assessment & Plan:  1. Viral conjunctivitis - OTC antihistamine eye drops - Cold packs to eyes - Wash hands multiple times throughout the day.  - Follow up if no improvement in the next 2-3 days.

## 2015-06-09 ENCOUNTER — Ambulatory Visit (INDEPENDENT_AMBULATORY_CARE_PROVIDER_SITE_OTHER): Payer: BC Managed Care – PPO

## 2015-06-09 DIAGNOSIS — Z111 Encounter for screening for respiratory tuberculosis: Secondary | ICD-10-CM | POA: Diagnosis not present

## 2015-06-11 LAB — TB SKIN TEST
Induration: 0 mm
TB SKIN TEST: NEGATIVE

## 2015-11-09 ENCOUNTER — Other Ambulatory Visit: Payer: Self-pay

## 2015-11-09 DIAGNOSIS — Z1231 Encounter for screening mammogram for malignant neoplasm of breast: Secondary | ICD-10-CM

## 2015-12-13 ENCOUNTER — Ambulatory Visit
Admission: RE | Admit: 2015-12-13 | Discharge: 2015-12-13 | Disposition: A | Discharge status: Home / Self Care | Payer: BC Managed Care – PPO | Source: Ambulatory Visit

## 2015-12-13 ENCOUNTER — Other Ambulatory Visit: Payer: Self-pay

## 2015-12-13 DIAGNOSIS — Z1231 Encounter for screening mammogram for malignant neoplasm of breast: Secondary | ICD-10-CM

## 2016-03-07 ENCOUNTER — Ambulatory Visit (INDEPENDENT_AMBULATORY_CARE_PROVIDER_SITE_OTHER): Payer: BC Managed Care – PPO | Admitting: Internal Medicine

## 2016-03-07 ENCOUNTER — Encounter: Payer: Self-pay | Admitting: Internal Medicine

## 2016-03-07 VITALS — BP 130/76 | HR 62 | Temp 98.9°F | Resp 16 | Ht 63.0 in | Wt 178.0 lb

## 2016-03-07 DIAGNOSIS — M150 Primary generalized (osteo)arthritis: Secondary | ICD-10-CM | POA: Diagnosis not present

## 2016-03-07 DIAGNOSIS — M159 Polyosteoarthritis, unspecified: Secondary | ICD-10-CM

## 2016-03-07 MED ORDER — DICLOFENAC SODIUM 1 % TD GEL: 2.0000 g | Freq: Three times a day (TID) | TRANSDERMAL | Status: DC | PRN

## 2016-03-07 NOTE — Patient Instructions (Signed)
We have sent in voltaren gel to use on the elbow and the knee for the pain. You can use it as needed up to 3 times per day.  Lateral Epicondylitis With Rehab Lateral epicondylitis involves inflammation and pain around the outer portion of the elbow. The pain is caused by inflammation of the tendons in the forearm that bring back (extend) the wrist. Lateral epicondylitis is also called tennis elbow, because it is very common in tennis players. However, it may occur in any individual who extends the wrist repetitively. If lateral epicondylitis is left untreated, it may become a chronic problem. SYMPTOMS   Pain, tenderness, and inflammation on the outer (lateral) side of the elbow.  Pain or weakness with gripping activities.  Pain that increases with wrist-twisting motions (playing tennis, using a screwdriver, opening a door or a jar).  Pain with lifting objects, including a coffee cup. CAUSES  Lateral epicondylitis is caused by inflammation of the tendons that extend the wrist. Causes of injury may include:  Repetitive stress and strain on the muscles and tendons that extend the wrist.  Sudden change in activity level or intensity.  Incorrect grip in racquet sports.  Incorrect grip size of racquet (often too large).  Incorrect hitting position or technique (usually backhand, leading with the elbow).  Using a racket that is too heavy. RISK INCREASES WITH:  Sports or occupations that require repetitive and/or strenuous forearm and wrist movements (tennis, squash, racquetball, carpentry).  Poor wrist and forearm strength and flexibility.  Failure to warm up properly before activity.  Resuming activity before healing, rehabilitation, and conditioning are complete. PREVENTION   Warm up and stretch properly before activity.  Maintain physical fitness:  Strength, flexibility, and endurance.  Cardiovascular fitness.  Wear and use properly fitted equipment.  Learn and use proper  technique and have a coach correct improper technique.  Wear a tennis elbow (counterforce) brace. PROGNOSIS  The course of this condition depends on the degree of the injury. If treated properly, acute cases (symptoms lasting less than 4 weeks) are often resolved in 2 to 6 weeks. Chronic (longer lasting cases) often resolve in 3 to 6 months but may require physical therapy. RELATED COMPLICATIONS   Frequently recurring symptoms, resulting in a chronic problem. Properly treating the problem the first time decreases frequency of recurrence.  Chronic inflammation, scarring tendon degeneration, and partial tendon tear, requiring surgery.  Delayed healing or resolution of symptoms. TREATMENT  Treatment first involves the use of ice and medicine to reduce pain and inflammation. Strengthening and stretching exercises may help reduce discomfort if performed regularly. These exercises may be performed at home if the condition is an acute injury. Chronic cases may require a referral to a physical therapist for evaluation and treatment. Your caregiver may advise a corticosteroid injection to help reduce inflammation. Rarely, surgery is needed. MEDICATION  If pain medicine is needed, nonsteroidal anti-inflammatory medicines (aspirin and ibuprofen), or other minor pain relievers (acetaminophen), are often advised.  Do not take pain medicine for 7 days before surgery.  Prescription pain relievers may be given, if your caregiver thinks they are needed. Use only as directed and only as much as you need.  Corticosteroid injections may be recommended. These injections should be reserved only for the most severe cases, because they can only be given a certain number of times. HEAT AND COLD  Cold treatment (icing) should be applied for 10 to 15 minutes every 2 to 3 hours for inflammation and pain, and immediately after  activity that aggravates your symptoms. Use ice packs or an ice massage.  Heat treatment may  be used before performing stretching and strengthening activities prescribed by your caregiver, physical therapist, or athletic trainer. Use a heat pack or a warm water soak. SEEK MEDICAL CARE IF: Symptoms get worse or do not improve in 2 weeks, despite treatment. EXERCISES  RANGE OF MOTION (ROM) AND STRETCHING EXERCISES - Epicondylitis, Lateral (Tennis Elbow) These exercises may help you when beginning to rehabilitate your injury. Your symptoms may go away with or without further involvement from your physician, physical therapist, or athletic trainer. While completing these exercises, remember:   Restoring tissue flexibility helps normal motion to return to the joints. This allows healthier, less painful movement and activity.  An effective stretch should be held for at least 30 seconds.  A stretch should never be painful. You should only feel a gentle lengthening or release in the stretched tissue. RANGE OF MOTION - Wrist Flexion, Active-Assisted  Extend your right / left elbow with your fingers pointing down.*  Gently pull the back of your hand towards you, until you feel a gentle stretch on the top of your forearm.  Hold this position for __________ seconds. Repeat __________ times. Complete this exercise __________ times per day.  *If directed by your physician, physical therapist or athletic trainer, complete this stretch with your elbow bent, rather than extended. RANGE OF MOTION - Wrist Extension, Active-Assisted  Extend your right / left elbow and turn your palm upwards.*  Gently pull your palm and fingertips back, so your wrist extends and your fingers point more toward the ground.  You should feel a gentle stretch on the inside of your forearm.  Hold this position for __________ seconds. Repeat __________ times. Complete this exercise __________ times per day. *If directed by your physician, physical therapist or athletic trainer, complete this stretch with your elbow bent,  rather than extended. STRETCH - Wrist Flexion  Place the back of your right / left hand on a tabletop, leaving your elbow slightly bent. Your fingers should point away from your body.  Gently press the back of your hand down onto the table by straightening your elbow. You should feel a stretch on the top of your forearm.  Hold this position for __________ seconds. Repeat __________ times. Complete this stretch __________ times per day.  STRETCH - Wrist Extension   Place your right / left fingertips on a tabletop, leaving your elbow slightly bent. Your fingers should point backwards.  Gently press your fingers and palm down onto the table by straightening your elbow. You should feel a stretch on the inside of your forearm.  Hold this position for __________ seconds. Repeat __________ times. Complete this stretch __________ times per day.  STRENGTHENING EXERCISES - Epicondylitis, Lateral (Tennis Elbow) These exercises may help you when beginning to rehabilitate your injury. They may resolve your symptoms with or without further involvement from your physician, physical therapist, or athletic trainer. While completing these exercises, remember:   Muscles can gain both the endurance and the strength needed for everyday activities through controlled exercises.  Complete these exercises as instructed by your physician, physical therapist or athletic trainer. Increase the resistance and repetitions only as guided.  You may experience muscle soreness or fatigue, but the pain or discomfort you are trying to eliminate should never worsen during these exercises. If this pain does get worse, stop and make sure you are following the directions exactly. If the pain is still present  after adjustments, discontinue the exercise until you can discuss the trouble with your caregiver. STRENGTH - Wrist Flexors  Sit with your right / left forearm palm-up and fully supported on a table or countertop. Your elbow  should be resting below the height of your shoulder. Allow your wrist to extend over the edge of the surface.  Loosely holding a __________ weight, or a piece of rubber exercise band or tubing, slowly curl your hand up toward your forearm.  Hold this position for __________ seconds. Slowly lower the wrist back to the starting position in a controlled manner. Repeat __________ times. Complete this exercise __________ times per day.  STRENGTH - Wrist Extensors  Sit with your right / left forearm palm-down and fully supported on a table or countertop. Your elbow should be resting below the height of your shoulder. Allow your wrist to extend over the edge of the surface.  Loosely holding a __________ weight, or a piece of rubber exercise band or tubing, slowly curl your hand up toward your forearm.  Hold this position for __________ seconds. Slowly lower the wrist back to the starting position in a controlled manner. Repeat __________ times. Complete this exercise __________ times per day.  STRENGTH - Ulnar Deviators  Stand with a ____________________ weight in your right / left hand, or sit while holding a rubber exercise band or tubing, with your healthy arm supported on a table or countertop.  Move your wrist, so that your pinkie travels toward your forearm and your thumb moves away from your forearm.  Hold this position for __________ seconds and then slowly lower the wrist back to the starting position. Repeat __________ times. Complete this exercise __________ times per day STRENGTH - Radial Deviators  Stand with a ____________________ weight in your right / left hand, or sit while holding a rubber exercise band or tubing, with your injured arm supported on a table or countertop.  Raise your hand upward in front of you or pull up on the rubber tubing.  Hold this position for __________ seconds and then slowly lower the wrist back to the starting position. Repeat __________ times.  Complete this exercise __________ times per day. STRENGTH - Forearm Supinators   Sit with your right / left forearm supported on a table, keeping your elbow below shoulder height. Rest your hand over the edge, palm down.  Gently grip a hammer or a soup ladle.  Without moving your elbow, slowly turn your palm and hand upward to a "thumbs-up" position.  Hold this position for __________ seconds. Slowly return to the starting position. Repeat __________ times. Complete this exercise __________ times per day.  STRENGTH - Forearm Pronators   Sit with your right / left forearm supported on a table, keeping your elbow below shoulder height. Rest your hand over the edge, palm up.  Gently grip a hammer or a soup ladle.  Without moving your elbow, slowly turn your palm and hand upward to a "thumbs-up" position.  Hold this position for __________ seconds. Slowly return to the starting position. Repeat __________ times. Complete this exercise __________ times per day.  STRENGTH - Grip  Grasp a tennis ball, a dense sponge, or a large, rolled sock in your hand.  Squeeze as hard as you can, without increasing any pain.  Hold this position for __________ seconds. Release your grip slowly. Repeat __________ times. Complete this exercise __________ times per day.  STRENGTH - Elbow Extensors, Isometric  Stand or sit upright, on a firm surface.  Place your right / left arm so that your palm faces your stomach, and it is at the height of your waist.  Place your opposite hand on the underside of your forearm. Gently push up as your right / left arm resists. Push as hard as you can with both arms, without causing any pain or movement at your right / left elbow. Hold this stationary position for __________ seconds. Gradually release the tension in both arms. Allow your muscles to relax completely before repeating.   This information is not intended to replace advice given to you by your health care  provider. Make sure you discuss any questions you have with your health care provider.   Document Released: 09/11/2005 Document Revised: 10/02/2014 Document Reviewed: 12/24/2008 Elsevier Interactive Patient Education [employer].

## 2016-03-07 NOTE — Progress Notes (Signed)
   Subjective:    Patient ID: Amy Haynes, female    DOB: September 10, 1951, 65 y.o.   MRN: [id number]  HPI The patient is a 65 YO female coming in for knee pain. She has been struggling with it for years and it is worse in the last several months. She is taking tylenol for it rarely but she does not like taking medicine for pain. She denies any other medication changes. No injury or swelling in the knees.   Review of Systems  Constitutional: Negative for fever, activity change, appetite change, fatigue and unexpected weight change.  Respiratory: Negative for shortness of breath and wheezing.   Cardiovascular: Negative for chest pain, palpitations and leg swelling.  Gastrointestinal: Negative for abdominal pain, diarrhea, constipation and blood in stool.  Musculoskeletal: Positive for myalgias and arthralgias. Negative for joint swelling and gait problem.  Skin: Negative.   Neurological: Negative.   Psychiatric/Behavioral: Negative.       Objective:   Physical Exam  Constitutional: She is oriented to person, place, and time. She appears well-developed and well-nourished.  HENT:  Head: Normocephalic and atraumatic.  Eyes: EOM are normal.  Neck: Normal range of motion.  Cardiovascular: Normal rate and regular rhythm.   Pulmonary/Chest: Effort normal and breath sounds normal. No respiratory distress. She has no wheezes. She has no rales.  Abdominal: Soft. Bowel sounds are normal.  Musculoskeletal: She exhibits tenderness. She exhibits no edema.  Mild tenderness in the knees bilaterally, no fluid in the knees or swelling. ACL and PCL intact bilaterally.   Neurological: She is alert and oriented to person, place, and time. Coordination normal.  Skin: Skin is warm and dry.  Psychiatric: She has a normal mood and affect.   Filed Vitals:   03/07/16 1606  BP: 130/76  Pulse: 62  Temp: 98.9 F (37.2 C)  TempSrc: Oral  Resp: 16  Height: 5\' 3"  (1.6 m)  Weight: 178 lb (80.74 kg)  SpO2: 98%       Assessment & Plan:

## 2016-03-07 NOTE — Progress Notes (Signed)
Pre visit review using our clinic review tool, if applicable. No additional management support is needed unless otherwise documented below in the visit note. 

## 2016-03-10 DIAGNOSIS — M1990 Unspecified osteoarthritis, unspecified site: Secondary | ICD-10-CM | POA: Insufficient documentation

## 2016-03-10 NOTE — Assessment & Plan Note (Signed)
Rx for voltaren gel that she can use on her knees. We did discuss the need for routine exercise to help keep the joints mobile and decrease pain. She is not having enough pain to do anything further with it at this time.

## 2016-03-23 ENCOUNTER — Encounter: Payer: Self-pay | Admitting: Internal Medicine

## 2016-03-23 ENCOUNTER — Other Ambulatory Visit: Payer: Self-pay | Admitting: Internal Medicine

## 2016-03-23 ENCOUNTER — Telehealth: Payer: Self-pay

## 2016-03-23 ENCOUNTER — Other Ambulatory Visit (INDEPENDENT_AMBULATORY_CARE_PROVIDER_SITE_OTHER): Payer: BC Managed Care – PPO

## 2016-03-23 ENCOUNTER — Other Ambulatory Visit: Payer: Self-pay | Admitting: Geriatric Medicine

## 2016-03-23 ENCOUNTER — Ambulatory Visit (INDEPENDENT_AMBULATORY_CARE_PROVIDER_SITE_OTHER): Payer: BC Managed Care – PPO | Admitting: Internal Medicine

## 2016-03-23 VITALS — BP 124/68 | HR 55 | Temp 98.6°F | Resp 14 | Ht 63.0 in | Wt 176.0 lb

## 2016-03-23 DIAGNOSIS — Z0000 Encounter for general adult medical examination without abnormal findings: Secondary | ICD-10-CM

## 2016-03-23 DIAGNOSIS — I10 Essential (primary) hypertension: Secondary | ICD-10-CM

## 2016-03-23 DIAGNOSIS — M150 Primary generalized (osteo)arthritis: Secondary | ICD-10-CM

## 2016-03-23 DIAGNOSIS — Z418 Encounter for other procedures for purposes other than remedying health state: Secondary | ICD-10-CM

## 2016-03-23 DIAGNOSIS — Z1159 Encounter for screening for other viral diseases: Secondary | ICD-10-CM | POA: Diagnosis not present

## 2016-03-23 DIAGNOSIS — Z23 Encounter for immunization: Secondary | ICD-10-CM | POA: Diagnosis not present

## 2016-03-23 DIAGNOSIS — Z299 Encounter for prophylactic measures, unspecified: Secondary | ICD-10-CM

## 2016-03-23 DIAGNOSIS — E7800 Pure hypercholesterolemia, unspecified: Secondary | ICD-10-CM

## 2016-03-23 DIAGNOSIS — M159 Polyosteoarthritis, unspecified: Secondary | ICD-10-CM

## 2016-03-23 LAB — COMPREHENSIVE METABOLIC PANEL
ALT: 24 U/L (ref 0–35)
AST: 22 U/L (ref 0–37)
Albumin: 4.2 g/dL (ref 3.5–5.2)
Alkaline Phosphatase: 62 U/L (ref 39–117)
BUN: 16 mg/dL (ref 6–23)
CALCIUM: 9.8 mg/dL (ref 8.4–10.5)
CHLORIDE: 104 meq/L (ref 96–112)
CO2: 32 meq/L (ref 19–32)
CREATININE: 0.77 mg/dL (ref 0.40–1.20)
GFR: 97.01 mL/min (ref >60.00)
Glucose, Bld: 89 mg/dL (ref 70–99)
POTASSIUM: 4.1 meq/L (ref 3.5–5.1)
Sodium: 140 mEq/L (ref 135–145)
Total Bilirubin: 0.7 mg/dL (ref 0.2–1.2)
Total Protein: 8.2 g/dL (ref 6.0–8.3)

## 2016-03-23 LAB — CBC
HEMATOCRIT: 40.4 % (ref 36.0–46.0)
Hemoglobin: 13.4 g/dL (ref 12.0–15.0)
MCHC: 33.1 g/dL (ref 30.0–36.0)
MCV: 78.9 fl (ref 78.0–100.0)
Platelets: 173 10*3/uL (ref 150.0–400.0)
RBC: 5.12 Mil/uL — AB (ref 3.87–5.11)
RDW: 14.7 % (ref 11.5–15.5)
WBC: 3.6 10*3/uL — ABNORMAL LOW (ref 4.0–10.5)

## 2016-03-23 LAB — LIPID PANEL
CHOL/HDL RATIO: 2
CHOLESTEROL: 141 mg/dL (ref 0–200)
HDL: 62.3 mg/dL (ref >39.00)
LDL CALC: 67 mg/dL (ref 0–99)
NonHDL: 78.37
TRIGLYCERIDES: 58 mg/dL (ref 0.0–149.0)
VLDL: 11.6 mg/dL (ref 0.0–40.0)

## 2016-03-23 MED ORDER — TRIAMTERENE-HCTZ 37.5-25 MG PO TABS: ORAL_TABLET | ORAL | Status: DC

## 2016-03-23 MED ORDER — AMLODIPINE BESY-BENAZEPRIL HCL 10-20 MG PO CAPS: ORAL_CAPSULE | ORAL | Status: DC

## 2016-03-23 MED ORDER — DICLOFENAC SODIUM 1 % TD GEL: 2.0000 g | Freq: Three times a day (TID) | TRANSDERMAL | Status: DC | PRN

## 2016-03-23 MED ORDER — SIMVASTATIN 20 MG PO TABS: ORAL_TABLET | ORAL | Status: DC

## 2016-03-23 NOTE — Assessment & Plan Note (Signed)
Colonoscopy due this fall and reminded about that. Checking hep c screening. Declines need for hiv screening. Checking labs, given shingles shot to update immunizations. Given screening recommendations.

## 2016-03-23 NOTE — Assessment & Plan Note (Signed)
Checking lipid and CMP and adjust as needed. Takes simvastatin daily without side effects.

## 2016-03-23 NOTE — Addendum Note (Signed)
Addended by: Resa Miner R on: 03/23/2016 04:38 PM   Modules accepted: Orders

## 2016-03-23 NOTE — Telephone Encounter (Signed)
Sent to pharmacy 

## 2016-03-23 NOTE — Progress Notes (Signed)
   Subjective:    Patient ID: Amy Haynes, female    DOB: 1951-06-28, 65 y.o.   MRN: [id number]  HPI The patient is a 65 YO female coming in for wellness. No new concerns.   PMH, [facility], social history reviewed and updated.   Review of Systems  Constitutional: Negative for fever, activity change, appetite change, fatigue and unexpected weight change.  HENT: Negative.   Eyes: Negative.   Respiratory: Negative for shortness of breath and wheezing.   Cardiovascular: Negative for chest pain, palpitations and leg swelling.  Gastrointestinal: Negative for abdominal pain, diarrhea, constipation and blood in stool.  Musculoskeletal: Negative.   Skin: Negative.   Neurological: Negative.   Psychiatric/Behavioral: Negative.       Objective:   Physical Exam  Constitutional: She is oriented to person, place, and time. She appears well-developed and well-nourished.  HENT:  Head: Normocephalic and atraumatic.  Eyes: EOM are normal.  Neck: Normal range of motion.  Cardiovascular: Normal rate and regular rhythm.   Carotids without bruits.   Pulmonary/Chest: Effort normal and breath sounds normal. No respiratory distress. She has no wheezes. She has no rales.  Abdominal: Soft. Bowel sounds are normal. She exhibits no distension. There is no tenderness. There is no rebound.  Musculoskeletal: She exhibits no edema.  Neurological: She is alert and oriented to person, place, and time. Coordination normal.  Skin: Skin is warm and dry.  Psychiatric: She has a normal mood and affect.   Filed Vitals:   03/23/16 1016  BP: 124/68  Pulse: 55  Temp: 98.6 F (37 C)  TempSrc: Oral  Resp: 14  Height: 5\' 3"  (1.6 m)  Weight: 176 lb (79.833 kg)  SpO2: 98%      Assessment & Plan:  Shingles shot given at visit.

## 2016-03-23 NOTE — Progress Notes (Signed)
Pre visit review using our clinic review tool, if applicable. No additional management support is needed unless otherwise documented below in the visit note. 

## 2016-03-23 NOTE — Telephone Encounter (Signed)
Patient called and said that none of RX that she gets yearly refill on was at the pharmacy when she went to pick them up. I see that they was call in on June 26. Can you please send them again

## 2016-03-23 NOTE — Patient Instructions (Signed)
We have given you the shingles shot today.  We are checking the labs today and will send the results on mychart.   Health Maintenance, Female Adopting a healthy lifestyle and getting preventive care can go a long way to promote health and wellness. Talk with your health care provider about what schedule of regular examinations is right for you. This is a good chance for you to check in with your provider about disease prevention and staying healthy. In between checkups, there are plenty of things you can do on your own. Experts have done a lot of research about which lifestyle changes and preventive measures are most likely to keep you healthy. Ask your health care provider for more information. WEIGHT AND DIET  Eat a healthy diet  Be sure to include plenty of vegetables, fruits, low-fat dairy products, and lean protein.  Do not eat a lot of foods high in solid fats, added sugars, or salt.  Get regular exercise. This is one of the most important things you can do for your health.  Most adults should exercise for at least 150 minutes each week. The exercise should increase your heart rate and make you sweat (moderate-intensity exercise).  Most adults should also do strengthening exercises at least twice a week. This is in addition to the moderate-intensity exercise.  Maintain a healthy weight  Body mass index (BMI) is a measurement that can be used to identify possible weight problems. It estimates body fat based on height and weight. Your health care provider can help determine your BMI and help you achieve or maintain a healthy weight.  For females 20 years of age and older:   A BMI below 18.5 is considered underweight.  A BMI of 18.5 to 24.9 is normal.  A BMI of 25 to 29.9 is considered overweight.  A BMI of 30 and above is considered obese.  Watch levels of cholesterol and blood lipids  You should start having your blood tested for lipids and cholesterol at 64 years of age,  then have this test every 5 years.  You may need to have your cholesterol levels checked more often if:  Your lipid or cholesterol levels are high.  You are older than 64 years of age.  You are at high risk for heart disease.  CANCER SCREENING   Lung Cancer  Lung cancer screening is recommended for adults 7-44 years old who are at high risk for lung cancer because of a history of smoking.  A yearly low-dose CT scan of the lungs is recommended for people who:  Currently smoke.  Have quit within the past 15 years.  Have at least a 30-pack-year history of smoking. A pack year is smoking an average of one pack of cigarettes a day for 1 year.  Yearly screening should continue until it has been 15 years since you quit.  Yearly screening should stop if you develop a health problem that would prevent you from having lung cancer treatment.  Breast Cancer  Practice breast self-awareness. This means understanding how your breasts normally appear and feel.  It also means doing regular breast self-exams. Let your health care provider know about any changes, no matter how small.  If you are in your 20s or 30s, you should have a clinical breast exam (CBE) by a health care provider every 1-3 years as part of a regular health exam.  If you are 23 or older, have a CBE every year. Also consider having a breast X-ray (mammogram)  every year.  If you have a family history of breast cancer, talk to your health care provider about genetic screening.  If you are at high risk for breast cancer, talk to your health care provider about having an MRI and a mammogram every year.  Breast cancer gene (BRCA) assessment is recommended for women who have family members with BRCA-related cancers. BRCA-related cancers include:  Breast.  Ovarian.  Tubal.  Peritoneal cancers.  Results of the assessment will determine the need for genetic counseling and BRCA1 and BRCA2 testing. Cervical Cancer Your  health care provider may recommend that you be screened regularly for cancer of the pelvic organs (ovaries, uterus, and vagina). This screening involves a pelvic examination, including checking for microscopic changes to the surface of your cervix (Pap test). You may be encouraged to have this screening done every 3 years, beginning at age 38.  For women ages 41-65, health care providers may recommend pelvic exams and Pap testing every 3 years, or they may recommend the Pap and pelvic exam, combined with testing for human papilloma virus (HPV), every 5 years. Some types of HPV increase your risk of cervical cancer. Testing for HPV may also be done on women of any age with unclear Pap test results.  Other health care providers may not recommend any screening for nonpregnant women who are considered low risk for pelvic cancer and who do not have symptoms. Ask your health care provider if a screening pelvic exam is right for you.  If you have had past treatment for cervical cancer or a condition that could lead to cancer, you need Pap tests and screening for cancer for at least 20 years after your treatment. If Pap tests have been discontinued, your risk factors (such as having a new sexual partner) need to be reassessed to determine if screening should resume. Some women have medical problems that increase the chance of getting cervical cancer. In these cases, your health care provider may recommend more frequent screening and Pap tests. Colorectal Cancer  This type of cancer can be detected and often prevented.  Routine colorectal cancer screening usually begins at 64 years of age and continues through 64 years of age.  Your health care provider may recommend screening at an earlier age if you have risk factors for colon cancer.  Your health care provider may also recommend using home test kits to check for hidden blood in the stool.  A small camera at the end of a tube can be used to examine your  colon directly (sigmoidoscopy or colonoscopy). This is done to check for the earliest forms of colorectal cancer.  Routine screening usually begins at age 72.  Direct examination of the colon should be repeated every 5-10 years through 64 years of age. However, you may need to be screened more often if early forms of precancerous polyps or small growths are found. Skin Cancer  Check your skin from head to toe regularly.  Tell your health care provider about any new moles or changes in moles, especially if there is a change in a mole's shape or color.  Also tell your health care provider if you have a mole that is larger than the size of a pencil eraser.  Always use sunscreen. Apply sunscreen liberally and repeatedly throughout the day.  Protect yourself by wearing long sleeves, pants, a wide-brimmed hat, and sunglasses whenever you are outside. HEART DISEASE, DIABETES, AND HIGH BLOOD PRESSURE   High blood pressure causes heart disease and  increases the risk of stroke. High blood pressure is more likely to develop in:  People who have blood pressure in the high end of the normal range (130-139/85-89 mm Hg).  People who are overweight or obese.  People who are African American.  If you are 53-87 years of age, have your blood pressure checked every 3-5 years. If you are 19 years of age or older, have your blood pressure checked every year. You should have your blood pressure measured twice--once when you are at a hospital or clinic, and once when you are not at a hospital or clinic. Record the average of the two measurements. To check your blood pressure when you are not at a hospital or clinic, you can use:  An automated blood pressure machine at a pharmacy.  A home blood pressure monitor.  If you are between 29 years and 11 years old, ask your health care provider if you should take aspirin to prevent strokes.  Have regular diabetes screenings. This involves taking a blood sample to  check your fasting blood sugar level.  If you are at a normal weight and have a low risk for diabetes, have this test once every three years after 64 years of age.  If you are overweight and have a high risk for diabetes, consider being tested at a younger age or more often. PREVENTING INFECTION  Hepatitis B  If you have a higher risk for hepatitis B, you should be screened for this virus. You are considered at high risk for hepatitis B if:  You were born in a country where hepatitis B is common. Ask your health care provider which countries are considered high risk.  Your parents were born in a high-risk country, and you have not been immunized against hepatitis B (hepatitis B vaccine).  You have HIV or AIDS.  You use needles to inject street drugs.  You live with someone who has hepatitis B.  You have had sex with someone who has hepatitis B.  You get hemodialysis treatment.  You take certain medicines for conditions, including cancer, organ transplantation, and autoimmune conditions. Hepatitis C  Blood testing is recommended for:  Everyone born from [birth year] through 1964.  Anyone with known risk factors for hepatitis C. Sexually transmitted infections (STIs)  You should be screened for sexually transmitted infections (STIs) including gonorrhea and chlamydia if:  You are sexually active and are younger than 64 years of age.  You are older than 64 years of age and your health care provider tells you that you are at risk for this type of infection.  Your sexual activity has changed since you were last screened and you are at an increased risk for chlamydia or gonorrhea. Ask your health care provider if you are at risk.  If you do not have HIV, but are at risk, it may be recommended that you take a prescription medicine daily to prevent HIV infection. This is called pre-exposure prophylaxis (PrEP). You are considered at risk if:  You are sexually active and do not regularly use  condoms or know the HIV status of your partner(s).  You take drugs by injection.  You are sexually active with a partner who has HIV. Talk with your health care provider about whether you are at high risk of being infected with HIV. If you choose to begin PrEP, you should first be tested for HIV. You should then be tested every 3 months for as long as you are taking PrEP.  PREGNANCY   If you are premenopausal and you may become pregnant, ask your health care provider about preconception counseling.  If you may become pregnant, take 400 to 800 micrograms (mcg) of folic acid every day.  If you want to prevent pregnancy, talk to your health care provider about birth control (contraception). OSTEOPOROSIS AND MENOPAUSE   Osteoporosis is a disease in which the bones lose minerals and strength with aging. This can result in serious bone fractures. Your risk for osteoporosis can be identified using a bone density scan.  If you are 47 years of age or older, or if you are at risk for osteoporosis and fractures, ask your health care provider if you should be screened.  Ask your health care provider whether you should take a calcium or vitamin D supplement to lower your risk for osteoporosis.  Menopause may have certain physical symptoms and risks.  Hormone replacement therapy may reduce some of these symptoms and risks. Talk to your health care provider about whether hormone replacement therapy is right for you.  HOME CARE INSTRUCTIONS   Schedule regular health, dental, and eye exams.  Stay current with your immunizations.   Do not use any tobacco products including cigarettes, chewing tobacco, or electronic cigarettes.  If you are pregnant, do not drink alcohol.  If you are breastfeeding, limit how much and how often you drink alcohol.  Limit alcohol intake to no more than 1 drink per day for nonpregnant women. One drink equals 12 ounces of beer, 5 ounces of wine, or 1 ounces of hard  liquor.  Do not use street drugs.  Do not share needles.  Ask your health care provider for help if you need support or information about quitting drugs.  Tell your health care provider if you often feel depressed.  Tell your health care provider if you have ever been abused or do not feel safe at home.   This information is not intended to replace advice given to you by your health care provider. Make sure you discuss any questions you have with your health care provider.   Document Released: 03/27/2011 Document Revised: 10/02/2014 Document Reviewed: 08/13/2013 Elsevier Interactive Patient Education [employer].

## 2016-03-23 NOTE — Assessment & Plan Note (Signed)
Doing well with voltaren gel.

## 2016-03-23 NOTE — Assessment & Plan Note (Signed)
BP at goal on hctz and amlodipine and benazepril. Checking CMP and adjust as needed.

## 2016-03-24 ENCOUNTER — Other Ambulatory Visit: Payer: Self-pay | Admitting: *Deleted

## 2016-03-24 LAB — HEPATITIS C ANTIBODY: HCV AB: NEGATIVE

## 2016-03-24 MED ORDER — TRIAMTERENE-HCTZ 37.5-25 MG PO TABS: ORAL_TABLET | ORAL | Status: DC

## 2016-03-24 MED ORDER — SIMVASTATIN 20 MG PO TABS: ORAL_TABLET | ORAL | Status: DC

## 2016-03-24 MED ORDER — AMLODIPINE BESY-BENAZEPRIL HCL 10-20 MG PO CAPS: ORAL_CAPSULE | ORAL | Status: DC

## 2016-07-05 ENCOUNTER — Encounter: Payer: Self-pay | Admitting: Internal Medicine

## 2016-07-10 ENCOUNTER — Encounter: Payer: Self-pay | Admitting: Internal Medicine

## 2016-08-28 ENCOUNTER — Ambulatory Visit (INDEPENDENT_AMBULATORY_CARE_PROVIDER_SITE_OTHER): Payer: BC Managed Care – PPO | Admitting: Internal Medicine

## 2016-08-28 ENCOUNTER — Other Ambulatory Visit (INDEPENDENT_AMBULATORY_CARE_PROVIDER_SITE_OTHER): Payer: BC Managed Care – PPO

## 2016-08-28 ENCOUNTER — Encounter: Payer: Self-pay | Admitting: Internal Medicine

## 2016-08-28 VITALS — BP 122/72 | HR 67 | Temp 98.9°F | Resp 18 | Ht 63.0 in | Wt 181.0 lb

## 2016-08-28 DIAGNOSIS — I872 Venous insufficiency (chronic) (peripheral): Secondary | ICD-10-CM

## 2016-08-28 LAB — COMPREHENSIVE METABOLIC PANEL
ALBUMIN: 4.2 g/dL (ref 3.5–5.2)
ALT: 29 U/L (ref 0–35)
AST: 23 U/L (ref 0–37)
Alkaline Phosphatase: 69 U/L (ref 39–117)
BUN: 13 mg/dL (ref 6–23)
CALCIUM: 9.6 mg/dL (ref 8.4–10.5)
CHLORIDE: 103 meq/L (ref 96–112)
CO2: 30 mEq/L (ref 19–32)
CREATININE: 0.76 mg/dL (ref 0.40–1.20)
GFR: 98.35 mL/min (ref >60.00)
Glucose, Bld: 94 mg/dL (ref 70–99)
POTASSIUM: 3.7 meq/L (ref 3.5–5.1)
Sodium: 140 mEq/L (ref 135–145)
Total Bilirubin: 0.6 mg/dL (ref 0.2–1.2)
Total Protein: 8 g/dL (ref 6.0–8.3)

## 2016-08-28 LAB — CBC
HEMATOCRIT: 41.4 % (ref 36.0–46.0)
HEMOGLOBIN: 13.6 g/dL (ref 12.0–15.0)
MCHC: 32.8 g/dL (ref 30.0–36.0)
MCV: 80.2 fl (ref 78.0–100.0)
PLATELETS: 181 10*3/uL (ref 150.0–400.0)
RBC: 5.17 Mil/uL — AB (ref 3.87–5.11)
RDW: 14.6 % (ref 11.5–15.5)
WBC: 3.4 10*3/uL — ABNORMAL LOW (ref 4.0–10.5)

## 2016-08-28 LAB — BRAIN NATRIURETIC PEPTIDE: PRO B NATRI PEPTIDE: 12 pg/mL (ref 0.0–100.0)

## 2016-08-28 NOTE — Patient Instructions (Signed)
We are checking the labs today to check for a cause of the swelling but most people have swelling from the blood vessels in the legs getting leaky.  You can wear tight socking or hose to help prevent fluid when working outside or you can prop your feet up when you are done to help with the fluid.    Venous Stasis or Chronic Venous Insufficiency Chronic venous insufficiency, also called venous stasis, is a condition that affects the veins in the legs. The condition prevents blood from being pumped through these veins effectively. Blood may no longer be pumped effectively from the legs back to the heart. This condition can range from mild to severe. With proper treatment, you should be able to continue with an active life. CAUSES  Chronic venous insufficiency occurs when the vein walls become stretched, weakened, or damaged or when valves within the vein are damaged. Some common causes of this include:  High blood pressure inside the veins (venous hypertension).  Increased blood pressure in the leg veins from long periods of sitting or standing.  A blood clot that blocks blood flow in a vein (deep vein thrombosis).  Inflammation of a superficial vein (phlebitis) that causes a blood clot to form. RISK FACTORS Various things can make you more likely to develop chronic venous insufficiency, including:  Family history of this condition.  Obesity.  Pregnancy.  Sedentary lifestyle.  Smoking.  Jobs requiring long periods of standing or sitting in one place.  Being a certain age. Women in their 61s and 35s and men in their 105s are more likely to develop this condition. SIGNS AND SYMPTOMS  Symptoms may include:   Varicose veins.  Skin breakdown or ulcers.  Reddened or discolored skin on the leg.  Brown, smooth, tight, and painful skin just above the ankle, usually on the inside surface (lipodermatosclerosis).  Swelling. DIAGNOSIS  To diagnose this condition, your health care  provider will take a medical history and do a physical exam. The following tests may be ordered to confirm the diagnosis:  Duplex ultrasound-A procedure that produces a picture of a blood vessel and nearby organs and also provides information on blood flow through the blood vessel.  Plethysmography-A procedure that tests blood flow.  A venogram, or venography-A procedure used to look at the veins using X-ray and dye. TREATMENT The goals of treatment are to help you return to an active life and to minimize pain or disability. Treatment will depend on the severity of the condition. Medical procedures may be needed for severe cases. Treatment options may include:   Use of compression stockings. These can help with symptoms and lower the chances of the problem getting worse, but they do not cure the problem.  Sclerotherapy-A procedure involving an injection of a material that "dissolves" the damaged veins. Other veins in the network of blood vessels take over the function of the damaged veins.  Surgery to remove the vein or cut off blood flow through the vein (vein stripping or laser ablation surgery).  Surgery to repair a valve. HOME CARE INSTRUCTIONS   Wear compression stockings as directed by your health care provider.  Only take over-the-counter or prescription medicines for pain, discomfort, or fever as directed by your health care provider.  Follow up with your health care provider as directed. SEEK MEDICAL CARE IF:   You have redness, swelling, or increasing pain in the affected area.  You see a red streak or line that extends up or down from the  affected area.  You have a breakdown or loss of skin in the affected area, even if the breakdown is small.  You have an injury to the affected area. SEEK IMMEDIATE MEDICAL CARE IF:   You have an injury and open wound in the affected area.  Your pain is severe and does not improve with medicine.  You have sudden numbness or weakness  in the foot or ankle below the affected area, or you have trouble moving your foot or ankle.  You have a fever or persistent symptoms for more than 2-3 days.  You have a fever and your symptoms suddenly get worse. MAKE SURE YOU:   Understand these instructions.  Will watch your condition.  Will get help right away if you are not doing well or get worse. This information is not intended to replace advice given to you by your health care provider. Make sure you discuss any questions you have with your health care provider. Document Released: 01/15/2007 Document Revised: 07/02/2013 Document Reviewed: 05/19/2013 Elsevier Interactive Patient Education  2017 [employer].

## 2016-08-28 NOTE — Assessment & Plan Note (Signed)
Checking CBC, CMP, BNP to exclude etiology. No fluid on exam today and we talked about strategies to prevent and treat leg swelling and how this happens and most common etiology.

## 2016-08-28 NOTE — Progress Notes (Signed)
   Subjective:    Patient ID: Amy Haynes, female    DOB: [birth year], 65 y.o.   MRN: [id number]  HPI The patient is a 65 YO female coming in for some swelling in her ankles for about 1 week. She has had it off and on and the trigger seems to be doing extra yard work or exertion. She has been still taking her hctz and not missing doses. Disappears overnight. No color change.   Review of Systems  Constitutional: Negative for activity change, appetite change, fatigue, fever and unexpected weight change.  Respiratory: Negative.   Cardiovascular: Positive for leg swelling. Negative for chest pain and palpitations.  Gastrointestinal: Negative.   Musculoskeletal: Negative.   Skin: Negative.       Objective:   Physical Exam  Constitutional: She appears well-developed and well-nourished.  HENT:  Head: Normocephalic and atraumatic.  Cardiovascular: Normal rate and regular rhythm.   Pulmonary/Chest: Effort normal and breath sounds normal.  Abdominal: Soft. She exhibits no distension. There is no tenderness. There is no rebound.  Musculoskeletal: She exhibits no edema.  No edema in the legs, some varicose veins on the legs.    Vitals:   08/28/16 0941  BP: 122/72  Pulse: 67  Resp: 18  Temp: 98.9 F (37.2 C)  TempSrc: Oral  SpO2: 96%  Weight: 181 lb (82.1 kg)  Height: 5\' 3"  (1.6 m)      Assessment & Plan:

## 2016-08-30 ENCOUNTER — Ambulatory Visit (AMBULATORY_SURGERY_CENTER): Payer: Self-pay | Admitting: *Deleted

## 2016-08-30 ENCOUNTER — Encounter: Payer: Self-pay | Admitting: Internal Medicine

## 2016-08-30 VITALS — Ht 63.0 in | Wt 182.0 lb

## 2016-08-30 DIAGNOSIS — Z1211 Encounter for screening for malignant neoplasm of colon: Secondary | ICD-10-CM

## 2016-08-30 MED ORDER — NA SULFATE-K SULFATE-MG SULF 17.5-3.13-1.6 GM/177ML PO SOLN: 1.0000 | Freq: Once | ORAL | 0 refills | Status: AC

## 2016-08-30 NOTE — Progress Notes (Signed)
No egg or soy allergy known to patient  No issues with past sedation with any surgeries  or procedures, no intubation problems  No diet pills per patient No home 02 use per patient  No blood thinners per patient  Pt denies issues with constipation  No A fib or A flutter  emmi video to e mail    

## 2016-09-06 ENCOUNTER — Telehealth: Payer: Self-pay | Admitting: Internal Medicine

## 2016-09-06 NOTE — Telephone Encounter (Signed)
Pt wanted to call and let our office know that she has a burst blood vessel in her eye, she has not been seen by the eye doctor. Pt wanted to let our office know prior to her procedure.

## 2016-09-13 ENCOUNTER — Encounter: Payer: Self-pay | Admitting: Internal Medicine

## 2016-09-13 ENCOUNTER — Ambulatory Visit (AMBULATORY_SURGERY_CENTER): Payer: BC Managed Care – PPO | Admitting: Internal Medicine

## 2016-09-13 VITALS — BP 118/69 | HR 56 | Temp 98.2°F | Resp 14 | Ht 63.0 in | Wt 182.0 lb

## 2016-09-13 DIAGNOSIS — D123 Benign neoplasm of transverse colon: Secondary | ICD-10-CM

## 2016-09-13 DIAGNOSIS — Z1211 Encounter for screening for malignant neoplasm of colon: Secondary | ICD-10-CM

## 2016-09-13 DIAGNOSIS — D120 Benign neoplasm of cecum: Secondary | ICD-10-CM

## 2016-09-13 MED ORDER — SODIUM CHLORIDE 0.9 % IV SOLN: 500.0000 mL | INTRAVENOUS | Status: DC

## 2016-09-13 NOTE — Progress Notes (Signed)
Called to room to assist during endoscopic procedure.  Patient ID and intended procedure confirmed with present staff. Received instructions for my participation in the procedure from the performing physician.  

## 2016-09-13 NOTE — Op Note (Signed)
[place] Patient Name: Amy Haynes Procedure Date: 09/13/2016 9:32 AM MRN: [id number] Endoscopist: Docia Chuck. Henrene Pastor , MD Age: 65 Referring MD:  Date of Birth: 05/26/1951 Gender: Female Account #: [id number] Procedure:                Colonoscopy, with cold snare polypectomy X3 Indications:              Screening for colorectal malignant neoplasm. Index                            exam 2007 negative for neoplasia Medicines:                Monitored Anesthesia Care Procedure:                Pre-Anesthesia Assessment:                           - Prior to the procedure, a History and Physical                            was performed, and patient medications and                            allergies were reviewed. The patient's tolerance of                            previous anesthesia was also reviewed. The risks                            and benefits of the procedure and the sedation                            options and risks were discussed with the patient.                            All questions were answered, and informed consent                            was obtained. Prior Anticoagulants: The patient has                            taken no previous anticoagulant or antiplatelet                            agents. ASA Grade Assessment: II - A patient with                            mild systemic disease. After reviewing the risks                            and benefits, the patient was deemed in                            satisfactory condition to undergo the procedure.  After obtaining informed consent, the colonoscope                            was passed under direct vision. Throughout the                            procedure, the patient's blood pressure, pulse, and                            oxygen saturations were monitored continuously. The                            Model CF-HQ190L [id number]) scope was introduced      through the anus and advanced to the the cecum,                            identified by appendiceal orifice and ileocecal                            valve. The ileocecal valve, appendiceal orifice,                            and rectum were photographed. The quality of the                            bowel preparation was excellent. The colonoscopy                            was performed without difficulty. The patient                            tolerated the procedure well. The bowel preparation                            used was SUPREP. Scope In: 9:37:42 AM Scope Out: 9:53:55 AM Scope Withdrawal Time: 0 hours 13 minutes 34 seconds  Total Procedure Duration: 0 hours 16 minutes 13 seconds  Findings:                 Three polyps were found in the transverse colon and                            cecum. The polyps were 2 to 8 mm in size. These                            polyps were removed with a cold snare. Resection                            and retrieval were complete.                           A single small-mouthed diverticulum was found in  the ascending colon.                           The exam was otherwise without abnormality on                            direct and retroflexion views. Complications:            No immediate complications. Estimated blood loss:                            None. Estimated Blood Loss:     Estimated blood loss: none. Impression:               - Three 2 to 8 mm polyps in the transverse colon                            and in the cecum, removed with a cold snare.                            Resected and retrieved.                           - Diverticulosis in the ascending colon.                           - The examination was otherwise normal on direct                            and retroflexion views. Recommendation:           - Repeat colonoscopy in 3 years for surveillance.                           - Patient has a  contact number available for                            emergencies. The signs and symptoms of potential                            delayed complications were discussed with the                            patient. Return to normal activities tomorrow.                            Written discharge instructions were provided to the                            patient.                           - Resume previous diet.                           - Continue present medications.                           -  Await pathology results. Docia Chuck. Henrene Pastor, MD 09/13/2016 9:58:21 AM This report has been signed electronically.

## 2016-09-13 NOTE — Progress Notes (Signed)
To recovery, report to [place], RN, VSS 

## 2016-09-13 NOTE — Patient Instructions (Signed)
YOU HAD AN ENDOSCOPIC PROCEDURE TODAY AT [facility]:   Refer to the procedure report that was given to you for any specific questions about what was found during the examination.  If the procedure report does not answer your questions, please call your gastroenterologist to clarify.  If you requested that your care partner not be given the details of your procedure findings, then the procedure report has been included in a sealed envelope for you to review at your convenience later.  YOU SHOULD EXPECT: Some feelings of bloating in the abdomen. Passage of more gas than usual.  Walking can help get rid of the air that was put into your GI tract during the procedure and reduce the bloating. If you had a lower endoscopy (such as a colonoscopy or flexible sigmoidoscopy) you may notice spotting of blood in your stool or on the toilet paper. If you underwent a bowel prep for your procedure, you may not have a normal bowel movement for a few days.  Please Note:  You might notice some irritation and congestion in your nose or some drainage.  This is from the oxygen used during your procedure.  There is no need for concern and it should clear up in a day or so.  SYMPTOMS TO REPORT IMMEDIATELY:   Following lower endoscopy (colonoscopy or flexible sigmoidoscopy):  Excessive amounts of blood in the stool  Significant tenderness or worsening of abdominal pains  Swelling of the abdomen that is new, acute  Fever of 100F or higher  For urgent or emergent issues, a gastroenterologist can be reached at any hour by calling [phone].  DIET:  We do recommend a small meal at first, but then you may proceed to your regular diet.  Drink plenty of fluids but you should avoid alcoholic beverages for 24 hours.  ACTIVITY:  You should plan to take it easy for the rest of today and you should NOT DRIVE or use heavy machinery until tomorrow (because of the sedation medicines used during the test).     FOLLOW UP: Our staff will call the number listed on your records the next business day following your procedure to check on you and address any questions or concerns that you may have regarding the information given to you following your procedure. If we do not reach you, we will leave a message.  However, if you are feeling well and you are not experiencing any problems, there is no need to return our call.  We will assume that you have returned to your regular daily activities without incident.  If any biopsies were taken you will be contacted by phone or by letter within the next 1-3 weeks.  Please call us at [phone] if you have not heard about the biopsies in 3 weeks.   SIGNATURES/CONFIDENTIALITY: You and/or your care partner have signed paperwork which will be entered into your electronic medical record.  These signatures attest to the fact that that the information above on your After Visit Summary has been reviewed and is understood.  Full responsibility of the confidentiality of this discharge information lies with you and/or your care-partner.  Please read over handouts about polyps, diverticulosis and high fiber diets  Await pathology  Please continue your normal medications

## 2016-09-14 ENCOUNTER — Telehealth: Payer: Self-pay

## 2016-09-14 NOTE — Telephone Encounter (Signed)
  Follow up Call-  Call back number 09/13/2016  Post procedure Call Back phone  # [phone]  Permission to leave phone message Yes  Some recent data might be hidden     Patient questions:  Do you have a fever, pain , or abdominal swelling? No. Pain Score  0 *  Have you tolerated food without any problems? Yes.    Have you been able to return to your normal activities? Yes.    Do you have any questions about your discharge instructions: Diet   No. Medications  No. Follow up visit  No.  Do you have questions or concerns about your Care? No.  Actions: * If pain score is 4 or above: No action needed, pain <4.

## 2016-09-27 ENCOUNTER — Encounter: Payer: Self-pay | Admitting: Internal Medicine

## 2016-11-01 ENCOUNTER — Other Ambulatory Visit: Payer: Self-pay | Admitting: Internal Medicine

## 2016-11-01 DIAGNOSIS — Z1231 Encounter for screening mammogram for malignant neoplasm of breast: Secondary | ICD-10-CM

## 2016-12-13 ENCOUNTER — Ambulatory Visit: Payer: BC Managed Care – PPO

## 2016-12-13 ENCOUNTER — Ambulatory Visit
Admission: RE | Admit: 2016-12-13 | Discharge: 2016-12-13 | Disposition: A | Discharge status: Home / Self Care | Payer: BC Managed Care – PPO | Source: Ambulatory Visit | Attending: Internal Medicine | Admitting: Internal Medicine

## 2016-12-13 DIAGNOSIS — Z1231 Encounter for screening mammogram for malignant neoplasm of breast: Secondary | ICD-10-CM

## 2017-03-23 ENCOUNTER — Telehealth: Payer: Self-pay | Admitting: Internal Medicine

## 2017-03-23 NOTE — Telephone Encounter (Signed)
This pt has been working on trying to lower her BP. She has been taking supplements and having two spinal adjustments a week. Her chiropractor told her that if she feels dizzy due to her BP being to low to contact our office and see if her medications need to be changed. She is feeling dizzy today.

## 2017-03-26 ENCOUNTER — Encounter: Payer: BC Managed Care – PPO | Admitting: Internal Medicine

## 2017-03-26 NOTE — Telephone Encounter (Signed)
Has she been taking her blood pressures at home?

## 2017-03-26 NOTE — Telephone Encounter (Signed)
Please advise 

## 2017-03-27 NOTE — Telephone Encounter (Signed)
Patient contacted and stated she hasnt had any issues since that one day, she is feeling better and has been taking her BP at home and told me what its been running and it was running in the normal range, I told patient to just keep an eye on it and if she feels worse or if it drops low then to call or schedule an apt

## 2017-03-30 ENCOUNTER — Encounter: Payer: Self-pay | Admitting: Nurse Practitioner

## 2017-03-30 ENCOUNTER — Other Ambulatory Visit (INDEPENDENT_AMBULATORY_CARE_PROVIDER_SITE_OTHER): Payer: Medicare Other

## 2017-03-30 ENCOUNTER — Ambulatory Visit (INDEPENDENT_AMBULATORY_CARE_PROVIDER_SITE_OTHER): Payer: Medicare Other | Admitting: Nurse Practitioner

## 2017-03-30 VITALS — BP 132/76 | HR 53 | Temp 98.0°F | Ht 63.0 in | Wt 174.0 lb

## 2017-03-30 DIAGNOSIS — I10 Essential (primary) hypertension: Secondary | ICD-10-CM | POA: Diagnosis not present

## 2017-03-30 DIAGNOSIS — E7800 Pure hypercholesterolemia, unspecified: Secondary | ICD-10-CM | POA: Diagnosis not present

## 2017-03-30 DIAGNOSIS — Z0000 Encounter for general adult medical examination without abnormal findings: Secondary | ICD-10-CM

## 2017-03-30 LAB — BASIC METABOLIC PANEL
BUN: 15 mg/dL (ref 6–23)
CHLORIDE: 103 meq/L (ref 96–112)
CO2: 31 mEq/L (ref 19–32)
Calcium: 9.8 mg/dL (ref 8.4–10.5)
Creatinine, Ser: 0.76 mg/dL (ref 0.40–1.20)
GFR: 98.17 mL/min (ref >60.00)
GLUCOSE: 94 mg/dL (ref 70–99)
POTASSIUM: 3.7 meq/L (ref 3.5–5.1)
SODIUM: 140 meq/L (ref 135–145)

## 2017-03-30 LAB — LIPID PANEL
CHOL/HDL RATIO: 2
CHOLESTEROL: 141 mg/dL (ref 0–200)
HDL: 56.6 mg/dL (ref >39.00)
LDL CALC: 71 mg/dL (ref 0–99)
NonHDL: 84.78
Triglycerides: 71 mg/dL (ref 0.0–149.0)
VLDL: 14.2 mg/dL (ref 0.0–40.0)

## 2017-03-30 LAB — HEPATIC FUNCTION PANEL
ALT: 27 U/L (ref 0–35)
AST: 23 U/L (ref 0–37)
Albumin: 4.3 g/dL (ref 3.5–5.2)
Alkaline Phosphatase: 68 U/L (ref 39–117)
BILIRUBIN DIRECT: 0.2 mg/dL (ref 0.0–0.3)
BILIRUBIN TOTAL: 0.7 mg/dL (ref 0.2–1.2)
TOTAL PROTEIN: 8.4 g/dL — AB (ref 6.0–8.3)

## 2017-03-30 MED ORDER — AMLODIPINE BESY-BENAZEPRIL HCL 10-20 MG PO CAPS: ORAL_CAPSULE | ORAL | 3 refills | Status: DC

## 2017-03-30 MED ORDER — SIMVASTATIN 20 MG PO TABS: ORAL_TABLET | ORAL | 3 refills | Status: DC

## 2017-03-30 NOTE — Progress Notes (Signed)
Subjective:    Patient ID: Amy Haynes, female    DOB: [birth year], 66 y.o.   MRN: [id number]  Patient presents today for welcome to Medicare visit  HPI  Care Team:  Dr. Sharlet Salina: PCP. [facility]: Opthalmology Gracy Racer: Chiropractor Dr. Marius Ditch: Dentist  HTN: Home reading: 100s/70s- 120s/70s. Intermittently dizziness. Will like BP medication adjusted.  Immunizations: (TDAP, Hep C screen, Pneumovax, Influenza, zoster)  Health Maintenance  Topic Date Due  . HIV Screening  01/30/1967  . Pneumonia vaccines (1 of 2 - PCV13) 01/29/2017  . Flu Shot  04/25/2017  . Mammogram  12/14/2018  . Pap Smear  02/07/2019  . Colon Cancer Screening  09/14/2019  . Tetanus Vaccine  02/10/2024  . DEXA scan (bone density measurement)  Completed  .  Hepatitis C: One time screening is recommended by Center for Disease Control  (CDC) for  adults born from [birth year] through 1964.   Completed   Diet:heart healthy, portion control, no problem obtain food.  Weight:  Wt Readings from Last 3 Encounters:  03/30/17 174 lb (78.9 kg)  09/13/16 182 lb (82.6 kg)  08/30/16 182 lb (82.6 kg)    Exercise:walking, weight resistance 3x/week.  Fall Risk: Fall Risk  03/30/2017  Falls in the past year? 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  Depression/Suicide: Depression screen [facility] 2/9 03/30/2017 02/09/2014 01/29/2013  Decreased Interest 0 0 0  Down, Depressed, Hopeless 0 0 0  PHQ - 2 Score 0 0 0   Vision:up to date. Vision screen done.  Dental:up to date.  Denies any hearing loss.  Denies any memory loss: MMSE - Mini Mental State Exam 03/30/2017  Orientation to time 5  Orientation to Place 5  Registration 3  Attention/ Calculation 5  Recall 3  Language- name 2 objects 2  Language- repeat 1  Language- follow 3 step command 3  Language- read & follow direction 1  Write a sentence 1  Copy design 1  Total score 30    Advanced  Directive: information provided Advanced Directives 03/30/2017  Does Patient Have a Medical Advance Directive? No  Would patient like information on creating a medical advance directive? Yes (ED - Information included in AVS)   Sexual History (birth control, marital status, STD):single, not sexually active  Medications and allergies reviewed with patient and updated if appropriate.  Patient Active Problem List   Diagnosis Date Noted  . Osteoarthritis 03/10/2016  . Routine general medical examination at a health care facility 03/22/2015  . Chronic venous insufficiency 08/28/2008  . Microcytosis 08/28/2008  . HYPERCHOLESTEROLEMIA 08/26/2007  . Essential hypertension 08/26/2007    Current Outpatient Prescriptions on File Prior to Visit  Medication Sig Dispense Refill  . aspirin 81 MG tablet Take 81 mg by mouth daily.    . cholecalciferol (VITAMIN D) 1000 UNITS tablet Take 1,000 Units by mouth daily.    . Multiple Vitamins-Minerals (MULTIVITAMIN & MINERAL PO) Take 1 tablet by mouth daily.     Current Facility-Administered Medications on File Prior to Visit  Medication Dose Route Frequency Provider Last Rate Last Dose  . 0.9 %  sodium chloride infusion  500 mL Intravenous Continuous Irene Shipper, MD        Past Medical History:  Diagnosis Date  . Anxiety   . Diverticulosis of colon   . History of bronchitis   . Hypercholesteremia   . Hypertension   . Microcytosis   .  Venous insufficiency     Past Surgical History:  Procedure Laterality Date  . CESAREAN SECTION     x2  . COLONOSCOPY    . DENTAL SURGERY    . FOOT SURGERY  1991    Social History   Social History  . Marital status: Single    Spouse name: N/A  . Number of children: 2  . Years of education: N/A   Social History Main Topics  . Smoking status: Never Smoker  . Smokeless tobacco: Never Used  . Alcohol use Yes     Comment: social use  . Drug use: No  . Sexual activity: Not Currently   Other Topics  Concern  . None   Social History Narrative  . None    Family History  Problem Relation Age of Onset  . Arthritis Mother   . Hypertension Mother   . Cancer Mother 74       breast  . Kidney disease Sister   . Diabetes Sister   . Heart disease Sister   . Colon cancer Neg Hx   . Colon polyps Neg Hx   . Esophageal cancer Neg Hx   . Rectal cancer Neg Hx   . Stomach cancer Neg Hx         Review of Systems  Constitutional: Negative for fever, malaise/fatigue and weight loss.  HENT: Negative for congestion and sore throat.   Eyes:       Negative for visual changes  Respiratory: Negative for cough and shortness of breath.   Cardiovascular: Negative for chest pain, palpitations and leg swelling.  Gastrointestinal: Negative for blood in stool, constipation, diarrhea and heartburn.  Genitourinary: Negative for dysuria, frequency and urgency.  Musculoskeletal: Negative for falls, joint pain and myalgias.  Skin: Negative for rash.  Neurological: Positive for dizziness. Negative for sensory change, focal weakness, loss of consciousness and headaches.  Endo/Heme/Allergies: Does not bruise/bleed easily.  Psychiatric/Behavioral: Negative for depression, substance abuse and suicidal ideas. The patient is not nervous/anxious.    BMP Latest Ref Rng & Units 03/30/2017 08/28/2016 03/23/2016  Glucose 70 - 99 mg/dL 94 94 89  BUN 6 - 23 mg/dL '15 13 16  ' Creatinine 0.40 - 1.20 mg/dL 0.76 0.76 0.77  Sodium 135 - 145 mEq/L 140 140 140  Potassium 3.5 - 5.1 mEq/L 3.7 3.7 4.1  Chloride 96 - 112 mEq/L 103 103 104  CO2 19 - 32 mEq/L 31 30 32  Calcium 8.4 - 10.5 mg/dL 9.8 9.6 9.8   Lipid Panel     Component Value Date/Time   CHOL 141 03/30/2017 1015   TRIG 71.0 03/30/2017 1015   HDL 56.60 03/30/2017 1015   CHOLHDL 2 03/30/2017 1015   VLDL 14.2 03/30/2017 1015   LDLCALC 71 03/30/2017 1015   LDLDIRECT 127.4 12/02/2010 0913   Hepatic Function Latest Ref Rng & Units 03/30/2017 08/28/2016 03/23/2016    Total Protein 6.0 - 8.3 g/dL 8.4(H) 8.0 8.2  Albumin 3.5 - 5.2 g/dL 4.3 4.2 4.2  AST 0 - 37 U/L '23 23 22  ' ALT 0 - 35 U/L '27 29 24  ' Alk Phosphatase 39 - 117 U/L 68 69 62  Total Bilirubin 0.2 - 1.2 mg/dL 0.7 0.6 0.7  Bilirubin, Direct 0.0 - 0.3 mg/dL 0.2 - -   Objective:   Vitals:   03/30/17 0912  BP: 132/76  Pulse: (!) 53  Temp: 98 F (36.7 C)    Body mass index is 30.82 kg/m.   Physical Examination:  Physical  Exam  Constitutional: She is oriented to person, place, and time and well-developed, well-nourished, and in no distress. No distress.  HENT:  Right Ear: External ear normal.  Left Ear: External ear normal.  Nose: Nose normal.  Mouth/Throat: Oropharynx is clear and moist. No oropharyngeal exudate.  Eyes: Conjunctivae and EOM are normal. Pupils are equal, round, and reactive to light. No scleral icterus.  Neck: Normal range of motion. Neck supple. No thyromegaly present.  Cardiovascular: Normal rate, normal heart sounds and intact distal pulses.   Pulmonary/Chest: Effort normal and breath sounds normal. She exhibits no tenderness.  Abdominal: Soft. Bowel sounds are normal. She exhibits no distension. There is no tenderness.  Musculoskeletal: Normal range of motion. She exhibits no edema or tenderness.  Lymphadenopathy:    She has no cervical adenopathy.  Neurological: She is alert and oriented to person, place, and time. Gait normal.  Skin: Skin is warm and dry.  Psychiatric: Affect and judgment normal.    ASSESSMENT and PLAN:  Amy Haynes was seen today for annual exam.  Diagnoses and all orders for this visit:  Welcome to Medicare preventive visit  Essential hypertension -     Basic metabolic panel; Future -     amLODipine-benazepril (LOTREL) 10-20 MG capsule; take 1 capsule by mouth once daily  HYPERCHOLESTEROLEMIA -     Lipid panel; Future -     Hepatic function panel; Future -     simvastatin (ZOCOR) 20 MG tablet; take 1 tablet by mouth every  evening   No problem-specific Assessment & Plan notes found for this encounter.     Follow up: Return in about 6 months (around 09/30/2017) for with dr. Sharlet Salina.  Wilfred Lacy, NP

## 2017-03-30 NOTE — Patient Instructions (Addendum)
Go to basement for blood draw.  Consider decreasing Maxzide to half tab once a day if electrolytes are stable. Consider stopping maxzide if electrolyte are significantly abnormal. Maintain adequate oral hydration.  Amy Haynes , Thank you for taking time to come for your Medicare Wellness Visit. I appreciate your ongoing commitment to your health goals. Please review the following plan we discussed and let me know if I can assist you in the future.   These are the goals we discussed: Obtain pneumonia vaccine. Continue healthy diet and regular exerecise.   This is a list of the screening recommended for you and due dates:  Health Maintenance  Topic Date Due  . HIV Screening  01/30/1967  . Pneumonia vaccines (1 of 2 - PCV13) 01/29/2017  . Flu Shot  04/25/2017  . Mammogram  12/14/2018  . Pap Smear  02/07/2019  . Colon Cancer Screening  09/14/2019  . Tetanus Vaccine  02/10/2024  . DEXA scan (bone density measurement)  Completed  .  Hepatitis C: One time screening is recommended by Center for Disease Control  (CDC) for  adults born from [birth year] through 1964.   Completed

## 2017-04-18 ENCOUNTER — Other Ambulatory Visit: Payer: Self-pay | Admitting: Internal Medicine

## 2017-04-18 DIAGNOSIS — I10 Essential (primary) hypertension: Secondary | ICD-10-CM

## 2017-04-24 ENCOUNTER — Other Ambulatory Visit: Payer: Self-pay | Admitting: Internal Medicine

## 2017-04-24 DIAGNOSIS — I10 Essential (primary) hypertension: Secondary | ICD-10-CM

## 2017-05-03 ENCOUNTER — Other Ambulatory Visit: Payer: Self-pay | Admitting: Internal Medicine

## 2017-05-03 DIAGNOSIS — I10 Essential (primary) hypertension: Secondary | ICD-10-CM

## 2017-07-17 ENCOUNTER — Ambulatory Visit (INDEPENDENT_AMBULATORY_CARE_PROVIDER_SITE_OTHER): Payer: Medicare Other | Admitting: Internal Medicine

## 2017-07-17 ENCOUNTER — Other Ambulatory Visit (INDEPENDENT_AMBULATORY_CARE_PROVIDER_SITE_OTHER): Payer: Medicare Other

## 2017-07-17 ENCOUNTER — Encounter: Payer: Self-pay | Admitting: Internal Medicine

## 2017-07-17 VITALS — BP 130/84 | HR 58 | Temp 98.6°F | Ht 63.0 in | Wt 167.0 lb

## 2017-07-17 DIAGNOSIS — Z0000 Encounter for general adult medical examination without abnormal findings: Secondary | ICD-10-CM | POA: Diagnosis not present

## 2017-07-17 DIAGNOSIS — E7800 Pure hypercholesterolemia, unspecified: Secondary | ICD-10-CM

## 2017-07-17 DIAGNOSIS — E559 Vitamin D deficiency, unspecified: Secondary | ICD-10-CM

## 2017-07-17 DIAGNOSIS — I10 Essential (primary) hypertension: Secondary | ICD-10-CM

## 2017-07-17 DIAGNOSIS — Z23 Encounter for immunization: Secondary | ICD-10-CM

## 2017-07-17 LAB — TSH: TSH: 1.03 u[IU]/mL (ref 0.35–4.50)

## 2017-07-17 LAB — VITAMIN D 25 HYDROXY (VIT D DEFICIENCY, FRACTURES): VITD: 46.64 ng/mL (ref 30.00–100.00)

## 2017-07-17 NOTE — Assessment & Plan Note (Signed)
Lipid panel reviewed and at goal on simvastatin 20 mg daily.

## 2017-07-17 NOTE — Assessment & Plan Note (Signed)
BP at goal on amlodipine/benazepril and hctz. Last BMP reviewed and at goal without indication for change.

## 2017-07-17 NOTE — Progress Notes (Signed)
   Subjective:    Patient ID: ARI ENGELBRECHT, female    DOB: 1951-06-04, 66 y.o.   MRN: [id number]  HPI The patient is a 66 YO female coming in for physical.   PMH, [facility], social history reviewed and updated.   Review of Systems  Constitutional: Negative.   HENT: Negative.   Eyes: Negative.   Respiratory: Negative for cough, chest tightness and shortness of breath.   Cardiovascular: Negative for chest pain, palpitations and leg swelling.  Gastrointestinal: Negative for abdominal distention, abdominal pain, constipation, diarrhea, nausea and vomiting.  Musculoskeletal: Negative.   Skin: Negative.   Neurological: Negative.   Psychiatric/Behavioral: Negative.       Objective:   Physical Exam  Constitutional: She is oriented to person, place, and time. She appears well-developed and well-nourished.  HENT:  Head: Normocephalic and atraumatic.  Eyes: EOM are normal.  Neck: Normal range of motion.  Cardiovascular: Normal rate and regular rhythm.   Pulmonary/Chest: Effort normal and breath sounds normal. No respiratory distress. She has no wheezes. She has no rales.  Abdominal: Soft. Bowel sounds are normal. She exhibits no distension. There is no tenderness. There is no rebound.  Musculoskeletal: She exhibits no edema.  Neurological: She is alert and oriented to person, place, and time. Coordination normal.  Skin: Skin is warm and dry.  Psychiatric: She has a normal mood and affect.   Vitals:   07/17/17 0802  BP: 130/84  Pulse: (!) 58  Temp: 98.6 F (37 C)  TempSrc: Oral  SpO2: 98%  Weight: 167 lb (75.8 kg)  Height: 5\' 3"  (1.6 m)      Assessment & Plan:  Flu shot and prevnar 13 given at visit

## 2017-07-17 NOTE — Assessment & Plan Note (Signed)
Given flu shot and prevnar 13. Talked to her about lack of indication for more pap smear. Mammogram and colonoscopy up to date. Counseled about sun safety and mole surveillance as well as dangers of distracted driving. Given screening recommendations.

## 2017-07-17 NOTE — Patient Instructions (Signed)
We are checking the blood work today.   Health Maintenance, Female Adopting a healthy lifestyle and getting preventive care can go a long way to promote health and wellness. Talk with your health care provider about what schedule of regular examinations is right for you. This is a good chance for you to check in with your provider about disease prevention and staying healthy. In between checkups, there are plenty of things you can do on your own. Experts have done a lot of research about which lifestyle changes and preventive measures are most likely to keep you healthy. Ask your health care provider for more information. Weight and diet Eat a healthy diet  Be sure to include plenty of vegetables, fruits, low-fat dairy products, and lean protein.  Do not eat a lot of foods high in solid fats, added sugars, or salt.  Get regular exercise. This is one of the most important things you can do for your health. ? Most adults should exercise for at least 150 minutes each week. The exercise should increase your heart rate and make you sweat (moderate-intensity exercise). ? Most adults should also do strengthening exercises at least twice a week. This is in addition to the moderate-intensity exercise.  Maintain a healthy weight  Body mass index (BMI) is a measurement that can be used to identify possible weight problems. It estimates body fat based on height and weight. Your health care provider can help determine your BMI and help you achieve or maintain a healthy weight.  For females 54 years of age and older: ? A BMI below 18.5 is considered underweight. ? A BMI of 18.5 to 24.9 is normal. ? A BMI of 25 to 29.9 is considered overweight. ? A BMI of 30 and above is considered obese.  Watch levels of cholesterol and blood lipids  You should start having your blood tested for lipids and cholesterol at 65 years of age, then have this test every 5 years.  You may need to have your cholesterol levels  checked more often if: ? Your lipid or cholesterol levels are high. ? You are older than 65 years of age. ? You are at high risk for heart disease.  Cancer screening Lung Cancer  Lung cancer screening is recommended for adults 80-19 years old who are at high risk for lung cancer because of a history of smoking.  A yearly low-dose CT scan of the lungs is recommended for people who: ? Currently smoke. ? Have quit within the past 15 years. ? Have at least a 30-pack-year history of smoking. A pack year is smoking an average of one pack of cigarettes a day for 1 year.  Yearly screening should continue until it has been 15 years since you quit.  Yearly screening should stop if you develop a health problem that would prevent you from having lung cancer treatment.  Breast Cancer  Practice breast self-awareness. This means understanding how your breasts normally appear and feel.  It also means doing regular breast self-exams. Let your health care provider know about any changes, no matter how small.  If you are in your 20s or 30s, you should have a clinical breast exam (CBE) by a health care provider every 1-3 years as part of a regular health exam.  If you are 83 or older, have a CBE every year. Also consider having a breast X-ray (mammogram) every year.  If you have a family history of breast cancer, talk to your health care provider about  If you are at high risk for breast cancer, talk to your health care provider about having an MRI and a mammogram every year.  Breast cancer gene (BRCA) assessment is recommended for women who have family members with BRCA-related cancers. BRCA-related cancers include: ? Breast. ? Ovarian. ? Tubal. ? Peritoneal cancers.  Results of the assessment will determine the need for genetic counseling and BRCA1 and BRCA2 testing.  Cervical Cancer Your health care provider may recommend that you be screened regularly for cancer of the  pelvic organs (ovaries, uterus, and vagina). This screening involves a pelvic examination, including checking for microscopic changes to the surface of your cervix (Pap test). You may be encouraged to have this screening done every 3 years, beginning at age 21.  For women ages 30-65, health care providers may recommend pelvic exams and Pap testing every 3 years, or they may recommend the Pap and pelvic exam, combined with testing for human papilloma virus (HPV), every 5 years. Some types of HPV increase your risk of cervical cancer. Testing for HPV may also be done on women of any age with unclear Pap test results.  Other health care providers may not recommend any screening for nonpregnant women who are considered low risk for pelvic cancer and who do not have symptoms. Ask your health care provider if a screening pelvic exam is right for you.  If you have had past treatment for cervical cancer or a condition that could lead to cancer, you need Pap tests and screening for cancer for at least 20 years after your treatment. If Pap tests have been discontinued, your risk factors (such as having a new sexual partner) need to be reassessed to determine if screening should resume. Some women have medical problems that increase the chance of getting cervical cancer. In these cases, your health care provider may recommend more frequent screening and Pap tests.  Colorectal Cancer  This type of cancer can be detected and often prevented.  Routine colorectal cancer screening usually begins at 65 years of age and continues through 65 years of age.  Your health care provider may recommend screening at an earlier age if you have risk factors for colon cancer.  Your health care provider may also recommend using home test kits to check for hidden blood in the stool.  A small camera at the end of a tube can be used to examine your colon directly (sigmoidoscopy or colonoscopy). This is done to check for the  earliest forms of colorectal cancer.  Routine screening usually begins at age 50.  Direct examination of the colon should be repeated every 5-10 years through 65 years of age. However, you may need to be screened more often if early forms of precancerous polyps or small growths are found.  Skin Cancer  Check your skin from head to toe regularly.  Tell your health care provider about any new moles or changes in moles, especially if there is a change in a mole's shape or color.  Also tell your health care provider if you have a mole that is larger than the size of a pencil eraser.  Always use sunscreen. Apply sunscreen liberally and repeatedly throughout the day.  Protect yourself by wearing long sleeves, pants, a wide-brimmed hat, and sunglasses whenever you are outside.  Heart disease, diabetes, and high blood pressure  High blood pressure causes heart disease and increases the risk of stroke. High blood pressure is more likely to develop in: ? People who have blood   who have blood pressure in the high end of the normal range (130-139/85-89 mm Hg). ? People who are overweight or obese. ? People who are African American.  If you are 5-22 years of age, have your blood pressure checked every 3-5 years. If you are 69 years of age or older, have your blood pressure checked every year. You should have your blood pressure measured twice-once when you are at a hospital or clinic, and once when you are not at a hospital or clinic. Record the average of the two measurements. To check your blood pressure when you are not at a hospital or clinic, you can use: ? An automated blood pressure machine at a pharmacy. ? A home blood pressure monitor.  If you are between 29 years and 27 years old, ask your health care provider if you should take aspirin to prevent strokes.  Have regular diabetes screenings. This involves taking a blood sample to check your fasting blood sugar level. ? If you are at a normal weight and  have a low risk for diabetes, have this test once every three years after 65 years of age. ? If you are overweight and have a high risk for diabetes, consider being tested at a younger age or more often. Preventing infection Hepatitis B  If you have a higher risk for hepatitis B, you should be screened for this virus. You are considered at high risk for hepatitis B if: ? You were born in a country where hepatitis B is common. Ask your health care provider which countries are considered high risk. ? Your parents were born in a high-risk country, and you have not been immunized against hepatitis B (hepatitis B vaccine). ? You have HIV or AIDS. ? You use needles to inject street drugs. ? You live with someone who has hepatitis B. ? You have had sex with someone who has hepatitis B. ? You get hemodialysis treatment. ? You take certain medicines for conditions, including cancer, organ transplantation, and autoimmune conditions.  Hepatitis C  Blood testing is recommended for: ? Everyone born from [birth year] through 1964. ? Anyone with known risk factors for hepatitis C.  Sexually transmitted infections (STIs)  You should be screened for sexually transmitted infections (STIs) including gonorrhea and chlamydia if: ? You are sexually active and are younger than 65 years of age. ? You are older than 65 years of age and your health care provider tells you that you are at risk for this type of infection. ? Your sexual activity has changed since you were last screened and you are at an increased risk for chlamydia or gonorrhea. Ask your health care provider if you are at risk.  If you do not have HIV, but are at risk, it may be recommended that you take a prescription medicine daily to prevent HIV infection. This is called pre-exposure prophylaxis (PrEP). You are considered at risk if: ? You are sexually active and do not regularly use condoms or know the HIV status of your partner(s). ? You take drugs by  injection. ? You are sexually active with a partner who has HIV.  Talk with your health care provider about whether you are at high risk of being infected with HIV. If you choose to begin PrEP, you should first be tested for HIV. You should then be tested every 3 months for as long as you are taking PrEP. Pregnancy  If you are premenopausal and you may become pregnant, ask your health care  provider about preconception counseling.  If you may become pregnant, take 400 to 800 micrograms (mcg) of folic acid every day.  If you want to prevent pregnancy, talk to your health care provider about birth control (contraception). Osteoporosis and menopause  Osteoporosis is a disease in which the bones lose minerals and strength with aging. This can result in serious bone fractures. Your risk for osteoporosis can be identified using a bone density scan.  If you are 35 years of age or older, or if you are at risk for osteoporosis and fractures, ask your health care provider if you should be screened.  Ask your health care provider whether you should take a calcium or vitamin D supplement to lower your risk for osteoporosis.  Menopause may have certain physical symptoms and risks.  Hormone replacement therapy may reduce some of these symptoms and risks. Talk to your health care provider about whether hormone replacement therapy is right for you. Follow these instructions at home:  Schedule regular health, dental, and eye exams.  Stay current with your immunizations.  Do not use any tobacco products including cigarettes, chewing tobacco, or electronic cigarettes.  If you are pregnant, do not drink alcohol.  If you are breastfeeding, limit how much and how often you drink alcohol.  Limit alcohol intake to no more than 1 drink per day for nonpregnant women. One drink equals 12 ounces of beer, 5 ounces of wine, or 1 ounces of hard liquor.  Do not use street drugs.  Do not share needles.  Ask  your health care provider for help if you need support or information about quitting drugs.  Tell your health care provider if you often feel depressed.  Tell your health care provider if you have ever been abused or do not feel safe at home. This information is not intended to replace advice given to you by your health care provider. Make sure you discuss any questions you have with your health care provider. Document Released: 03/27/2011 Document Revised: 02/17/2016 Document Reviewed: 06/15/2015 Elsevier Interactive Patient Education  [employer].

## 2017-10-31 ENCOUNTER — Other Ambulatory Visit: Payer: Self-pay | Admitting: Internal Medicine

## 2017-10-31 DIAGNOSIS — Z1231 Encounter for screening mammogram for malignant neoplasm of breast: Secondary | ICD-10-CM

## 2017-12-14 ENCOUNTER — Ambulatory Visit
Admission: RE | Admit: 2017-12-14 | Discharge: 2017-12-14 | Disposition: A | Discharge status: Home / Self Care | Payer: Medicare Other | Source: Ambulatory Visit | Attending: Internal Medicine | Admitting: Internal Medicine

## 2017-12-14 DIAGNOSIS — Z1231 Encounter for screening mammogram for malignant neoplasm of breast: Secondary | ICD-10-CM

## 2018-02-22 ENCOUNTER — Telehealth: Payer: Self-pay | Admitting: Emergency Medicine

## 2018-02-22 NOTE — Telephone Encounter (Signed)
Called patient to schedule AWV. Patient declined at this time. 

## 2018-03-27 ENCOUNTER — Encounter: Payer: Self-pay | Admitting: Internal Medicine

## 2018-04-05 ENCOUNTER — Ambulatory Visit: Payer: Medicare Other

## 2018-04-16 ENCOUNTER — Ambulatory Visit (INDEPENDENT_AMBULATORY_CARE_PROVIDER_SITE_OTHER): Payer: Medicare Other | Admitting: Internal Medicine

## 2018-04-16 ENCOUNTER — Encounter: Payer: Self-pay | Admitting: Internal Medicine

## 2018-04-16 DIAGNOSIS — R21 Rash and other nonspecific skin eruption: Secondary | ICD-10-CM | POA: Diagnosis not present

## 2018-04-16 MED ORDER — TRIAMCINOLONE ACETONIDE 0.1 % EX CREA: 1.0000 "application " | TOPICAL_CREAM | Freq: Two times a day (BID) | CUTANEOUS | 0 refills | Status: DC

## 2018-04-16 NOTE — Assessment & Plan Note (Signed)
Rx for triamcinolone ointment for the rash. No signs of shingles or yeast infection on the skin.

## 2018-04-16 NOTE — Patient Instructions (Addendum)
We have sent in a cream that you can use twice a day for the itching.   You can use oral benadryl in the evening to help with itching. (25 mg pills)

## 2018-04-16 NOTE — Progress Notes (Signed)
   Subjective:    Patient ID: Amy Haynes, female    DOB: Jul 01, 1951, 67 y.o.   MRN: [id number]  HPI The patient is a 67 YO female coming in for rash on her buttocks. Started about 2-3 days ago. Denies known insect bite, tick bite, new soap or lotion or perfume or detergent. She did try topical benadryl which did not help. This is itchy but not painful unless she is scratching a lot. If she can avoid scratching can sit without pain. Overall is stable since onset. Denies fevers or chills. No muscle aches.   Review of Systems  Constitutional: Negative.   HENT: Negative.   Eyes: Negative.   Respiratory: Negative for cough, chest tightness and shortness of breath.   Cardiovascular: Negative for chest pain, palpitations and leg swelling.  Gastrointestinal: Negative for abdominal distention, abdominal pain, constipation, diarrhea, nausea and vomiting.  Musculoskeletal: Negative.   Skin: Positive for rash.  Neurological: Negative.       Objective:   Physical Exam  Constitutional: She is oriented to person, place, and time. She appears well-developed and well-nourished.  HENT:  Head: Normocephalic and atraumatic.  Eyes: EOM are normal.  Neck: Normal range of motion.  Cardiovascular: Normal rate and regular rhythm.  Pulmonary/Chest: Effort normal and breath sounds normal. No respiratory distress. She has no wheezes. She has no rales.  Abdominal: Soft.  Musculoskeletal: She exhibits no edema.  Neurological: She is alert and oriented to person, place, and time. Coordination normal.  Skin: Skin is warm and dry. Rash noted.  Small red rash on the left buttock, no abscess or blister appearance.    Vitals:   04/16/18 1347  BP: 104/62  Pulse: (!) 59  Temp: 98.2 F (36.8 C)  TempSrc: Oral  SpO2: 97%  Weight: 171 lb (77.6 kg)  Height: 5\' 3"  (1.6 m)      Assessment & Plan:

## 2018-04-19 ENCOUNTER — Telehealth: Payer: Self-pay | Admitting: Nurse Practitioner

## 2018-04-19 DIAGNOSIS — I10 Essential (primary) hypertension: Secondary | ICD-10-CM

## 2018-04-19 DIAGNOSIS — E7800 Pure hypercholesterolemia, unspecified: Secondary | ICD-10-CM

## 2018-04-23 MED ORDER — SIMVASTATIN 20 MG PO TABS: ORAL_TABLET | ORAL | 0 refills | Status: DC

## 2018-04-23 MED ORDER — AMLODIPINE BESY-BENAZEPRIL HCL 10-20 MG PO CAPS: ORAL_CAPSULE | ORAL | 0 refills | Status: DC

## 2018-04-23 NOTE — Telephone Encounter (Signed)
Reviewed chart pt is up-to-date sent refills to [facility].../lmb  

## 2018-04-23 NOTE — Telephone Encounter (Signed)
Please pt is under Dr Sharlet Salina care, please advise

## 2018-04-23 NOTE — Addendum Note (Signed)
Addended by: Earnstine Regal on: 04/23/2018 02:39 PM   Modules accepted: Orders

## 2018-06-18 ENCOUNTER — Other Ambulatory Visit: Payer: Self-pay | Admitting: Internal Medicine

## 2018-06-18 DIAGNOSIS — I10 Essential (primary) hypertension: Secondary | ICD-10-CM

## 2018-07-18 ENCOUNTER — Other Ambulatory Visit (INDEPENDENT_AMBULATORY_CARE_PROVIDER_SITE_OTHER): Payer: Medicare Other

## 2018-07-18 ENCOUNTER — Encounter: Payer: Self-pay | Admitting: Internal Medicine

## 2018-07-18 ENCOUNTER — Ambulatory Visit (INDEPENDENT_AMBULATORY_CARE_PROVIDER_SITE_OTHER): Payer: Medicare Other | Admitting: Internal Medicine

## 2018-07-18 VITALS — BP 130/84 | HR 53 | Temp 98.3°F | Ht 63.0 in | Wt 172.0 lb

## 2018-07-18 DIAGNOSIS — E7800 Pure hypercholesterolemia, unspecified: Secondary | ICD-10-CM

## 2018-07-18 DIAGNOSIS — Z0000 Encounter for general adult medical examination without abnormal findings: Secondary | ICD-10-CM

## 2018-07-18 DIAGNOSIS — Z23 Encounter for immunization: Secondary | ICD-10-CM

## 2018-07-18 DIAGNOSIS — I10 Essential (primary) hypertension: Secondary | ICD-10-CM | POA: Diagnosis not present

## 2018-07-18 LAB — COMPREHENSIVE METABOLIC PANEL
ALBUMIN: 4.3 g/dL (ref 3.5–5.2)
ALK PHOS: 67 U/L (ref 39–117)
ALT: 26 U/L (ref 0–35)
AST: 24 U/L (ref 0–37)
BUN: 21 mg/dL (ref 6–23)
CO2: 30 mEq/L (ref 19–32)
Calcium: 9.8 mg/dL (ref 8.4–10.5)
Chloride: 105 mEq/L (ref 96–112)
Creatinine, Ser: 0.82 mg/dL (ref 0.40–1.20)
GFR: 89.57 mL/min (ref >60.00)
GLUCOSE: 93 mg/dL (ref 70–99)
Potassium: 3.9 mEq/L (ref 3.5–5.1)
Sodium: 141 mEq/L (ref 135–145)
TOTAL PROTEIN: 8.4 g/dL — AB (ref 6.0–8.3)
Total Bilirubin: 0.5 mg/dL (ref 0.2–1.2)

## 2018-07-18 LAB — CBC
HCT: 42.3 % (ref 36.0–46.0)
HEMOGLOBIN: 14.1 g/dL (ref 12.0–15.0)
MCHC: 33.4 g/dL (ref 30.0–36.0)
MCV: 80.6 fl (ref 78.0–100.0)
Platelets: 163 10*3/uL (ref 150.0–400.0)
RBC: 5.25 Mil/uL — ABNORMAL HIGH (ref 3.87–5.11)
RDW: 14.7 % (ref 11.5–15.5)
WBC: 3.3 10*3/uL — AB (ref 4.0–10.5)

## 2018-07-18 LAB — LIPID PANEL
Cholesterol: 145 mg/dL (ref 0–200)
HDL: 62.5 mg/dL (ref >39.00)
LDL Cholesterol: 74 mg/dL (ref 0–99)
NONHDL: 82.82
Total CHOL/HDL Ratio: 2
Triglycerides: 45 mg/dL (ref 0.0–149.0)
VLDL: 9 mg/dL (ref 0.0–40.0)

## 2018-07-18 LAB — VITAMIN B12: VITAMIN B 12: 1324 pg/mL — AB (ref 211–911)

## 2018-07-18 LAB — VITAMIN D 25 HYDROXY (VIT D DEFICIENCY, FRACTURES): VITD: 62.72 ng/mL (ref 30.00–100.00)

## 2018-07-18 LAB — HEMOGLOBIN A1C: HEMOGLOBIN A1C: 5.8 % (ref 4.6–6.5)

## 2018-07-18 LAB — TSH: TSH: 0.83 u[IU]/mL (ref 0.35–4.50)

## 2018-07-18 MED ORDER — TRIAMTERENE-HCTZ 37.5-25 MG PO TABS: 1.0000 | ORAL_TABLET | Freq: Every day | ORAL | 3 refills | Status: DC

## 2018-07-18 MED ORDER — AMLODIPINE BESY-BENAZEPRIL HCL 10-20 MG PO CAPS: ORAL_CAPSULE | ORAL | 3 refills | Status: DC

## 2018-07-18 MED ORDER — ZOSTER VAC RECOMB ADJUVANTED 50 MCG/0.5ML IM SUSR: 0.5000 mL | Freq: Once | INTRAMUSCULAR | 1 refills | Status: AC

## 2018-07-18 MED ORDER — SIMVASTATIN 20 MG PO TABS: ORAL_TABLET | ORAL | 3 refills | Status: DC

## 2018-07-18 NOTE — Assessment & Plan Note (Signed)
Checking lipid panel and adjust simvastatin 20 mg daily as needed.  

## 2018-07-18 NOTE — Assessment & Plan Note (Signed)
Flu shot given. Pneumonia 23 given to complete series. Shingrix rx given. Tetanus up to date. Colonoscopy due 2020. Mammogram up to date, incomplete and needs more imaging, pap smear not indicated and dexa up to date. Counseled about sun safety and mole surveillance. Counseled about the dangers of distracted driving. Given 10 year screening recommendations.

## 2018-07-18 NOTE — Assessment & Plan Note (Signed)
Checking CMP and adjust amlodipine/benazepril and hctz as needed. BP at goal.

## 2018-07-18 NOTE — Patient Instructions (Signed)

## 2018-07-18 NOTE — Progress Notes (Signed)
   Subjective:    Patient ID: Amy Haynes, female    DOB: 02-16-1951, 67 y.o.   MRN: [id number]  HPI Here for medicare wellness and physical, no new complaints. Please see A/P for status and treatment of chronic medical problems.   Diet: heart healthy Physical activity: active, line dancing Depression/mood screen: negative Hearing: intact to whispered voice Visual acuity: grossly norma, some dry eyel, performs annual eye exam  ADLs: capable Fall risk: none Home safety: good Cognitive evaluation: intact to orientation, naming, recall and repetition EOL planning: adv directives discussed  I have personally reviewed and have noted 1. The patient's medical and social history - reviewed today no changes 2. Their use of alcohol, tobacco or illicit drugs 3. Their current medications and supplements 4. The patient's functional ability including ADL's, fall risks, home safety risks and hearing or visual impairment. 5. Diet and physical activities 6. Evidence for depression or mood disorders 7. Care team reviewed and updated (available in snapshot)  Review of Systems  Constitutional: Negative.   HENT: Negative.   Eyes: Negative.   Respiratory: Negative for cough, chest tightness and shortness of breath.   Cardiovascular: Negative for chest pain, palpitations and leg swelling.  Gastrointestinal: Negative for abdominal distention, abdominal pain, constipation, diarrhea, nausea and vomiting.  Musculoskeletal: Negative.   Skin: Negative.   Neurological: Negative.   Psychiatric/Behavioral: Negative.       Objective:   Physical Exam  Constitutional: She is oriented to person, place, and time. She appears well-developed and well-nourished.  HENT:  Head: Normocephalic and atraumatic.  Eyes: EOM are normal.  Neck: Normal range of motion.  Cardiovascular: Normal rate and regular rhythm.  Pulmonary/Chest: Effort normal and breath sounds normal. No respiratory distress. She has no wheezes.  She has no rales.  Abdominal: Soft. Bowel sounds are normal. She exhibits no distension. There is no tenderness. There is no rebound.  Musculoskeletal: She exhibits no edema.  Neurological: She is alert and oriented to person, place, and time. Coordination normal.  Skin: Skin is warm and dry.  Psychiatric: She has a normal mood and affect.   Vitals:   07/18/18 0801  BP: 130/84  Pulse: (!) 53  Temp: 98.3 F (36.8 C)  TempSrc: Oral  SpO2: 99%  Weight: 172 lb (78 kg)  Height: 5\' 3"  (1.6 m)      Assessment & Plan:  Flu and pneumonia 23 given at visit

## 2018-07-20 ENCOUNTER — Other Ambulatory Visit: Payer: Self-pay | Admitting: Internal Medicine

## 2018-07-20 DIAGNOSIS — E7800 Pure hypercholesterolemia, unspecified: Secondary | ICD-10-CM

## 2018-07-20 DIAGNOSIS — I10 Essential (primary) hypertension: Secondary | ICD-10-CM

## 2018-08-21 ENCOUNTER — Other Ambulatory Visit: Payer: Self-pay | Admitting: Internal Medicine

## 2018-08-21 DIAGNOSIS — I10 Essential (primary) hypertension: Secondary | ICD-10-CM

## 2018-09-25 HISTORY — PX: COLONOSCOPY: SHX174

## 2018-11-27 ENCOUNTER — Other Ambulatory Visit: Payer: Self-pay | Admitting: Internal Medicine

## 2018-11-27 DIAGNOSIS — Z1231 Encounter for screening mammogram for malignant neoplasm of breast: Secondary | ICD-10-CM

## 2018-12-26 ENCOUNTER — Ambulatory Visit: Payer: Self-pay

## 2018-12-26 NOTE — Telephone Encounter (Signed)
Pt c/o mild intermittent sharp pains to left collar bone to the left neck right under her ear. Pt stated that the pan started at 1100 this morning and the pain would come and go. The pain would last for a few seconds  And reoccur every 45 minutes . Pt stated she is not SOB, sweating, denies chest pain or left arm pain. Pt stated she feels anxious and feels like her hear is racing. She stated she checker her heart rate earlier in the day and it was 88. When we rechecked it her heart rate was 96 per minute. Pt denies any other symptoms. Pt advised to monitor her symptoms. Pt advised to call 911 for worsening pain, chest pain, arm or jaw pain, SOB, sweating or any difficulty breathing or if she worsens. Routing note high priority to office. Pt verbalized understanding. Pt informed that NT will send note to her doctor and to expect a call from the office tomorrow.      Answer Assessment - Initial Assessment Questions 1. ONSET: "When did the pain begin?"      11 am 2. LOCATION: "Where does it hurt?"     Left collar bone to left neck under left ear 3. PATTERN "Does the pain come and go, or has it been constant since it started?"      Comes and goes lasts a second then gone every 45 minutes to every hour 4. SEVERITY: "How bad is the pain?"  (Scale 1-10; or mild, moderate, severe)   - MILD (1-3): doesn't interfere with normal activities    - MODERATE (4-7): interferes with normal activities or awakens from sleep    - SEVERE (8-10):  excruciating pain, unable to do any normal activities      Sharp, 2 5. RADIATION: "Does the pain go anywhere else, shoot into your arms?"     no 6. CORD SYMPTOMS: "Any weakness or numbness of the arms or legs?"     no 7. CAUSE: "What do you think is causing the neck pain?"     Pt does not know 8. NECK OVERUSE: "Any recent activities that involved turning or twisting the neck?"     no 9. OTHER SYMPTOMS: "Do you have any other symptoms?" (e.g., headache, fever, chest  pain, difficulty breathing, neck swelling)    No- 96 feels like heart is racing 10. PREGNANCY: "Is there any chance you are pregnant?" "When was your last menstrual period?"       n/a  Protocols used: NECK PAIN OR STIFFNESS-A-AH

## 2018-12-27 ENCOUNTER — Ambulatory Visit (INDEPENDENT_AMBULATORY_CARE_PROVIDER_SITE_OTHER): Payer: Medicare Other | Admitting: Internal Medicine

## 2018-12-27 ENCOUNTER — Encounter: Payer: Self-pay | Admitting: Internal Medicine

## 2018-12-27 DIAGNOSIS — M542 Cervicalgia: Secondary | ICD-10-CM | POA: Diagnosis not present

## 2018-12-27 NOTE — Telephone Encounter (Signed)
appt scheduled

## 2018-12-27 NOTE — Telephone Encounter (Signed)
Is this appropriate for virtual?

## 2018-12-27 NOTE — Assessment & Plan Note (Signed)
Suspect related to sleeping position. As resolved now no treatment or intervention is needed. Counseled about stretching exercises as well as proper sleep positioning. Monitor for recurrence of symptoms and alert [place] if this returns.

## 2018-12-27 NOTE — Progress Notes (Signed)
Virtual Visit via Video Note  I connected with Amy Haynes on 12/27/18 at  1:00 PM EDT by a video enabled telemedicine application and verified that I am speaking with the correct person using two identifiers.   I discussed the limitations of evaluation and management by telemedicine and the availability of in person appointments. The patient expressed understanding and agreed to proceed.  History of Present Illness: The patient is a 68 y.o. YO female with visit for neck pain and collar bone pain. Started yesterday morning. Has some sharp pains in her neck. They are feeling electric and last less than 1 minute. Denies any movement or bending or twisting brings it on. Lasted overall for about 4-6 hours. Has not had any more since around 4pm yesterday afternoon. Slept more flat and steady last night. Denies numbness or weakness. Denies fevers or chills or cough or SOB. Denies headaches. Overall it is gone now. Has tried nothing for it  Observations/Objective: Appearance: normal, breathing appears normal, good grooming, abdomen does not appear distended, throat normal, mental status is A and O times 3  BP 141/84 this morning at home, usually in the 130s/80s.  Assessment and Plan: See problem oriented charting  Follow Up Instructions: monitor for symptoms and report any changes  I discussed the assessment and treatment plan with the patient. The patient was provided an opportunity to ask questions and all were answered. The patient agreed with the plan and demonstrated an understanding of the instructions.   The patient was advised to call back or seek an in-person evaluation if the symptoms worsen or if the condition fails to improve as anticipated.  Hoyt Koch, MD

## 2018-12-27 NOTE — Telephone Encounter (Signed)
Can do virtual

## 2019-01-14 ENCOUNTER — Ambulatory Visit: Payer: Medicare Other

## 2019-02-11 ENCOUNTER — Telehealth: Payer: Medicare Other | Admitting: Physician Assistant

## 2019-02-11 DIAGNOSIS — N920 Excessive and frequent menstruation with regular cycle: Secondary | ICD-10-CM

## 2019-02-11 DIAGNOSIS — N898 Other specified noninflammatory disorders of vagina: Secondary | ICD-10-CM

## 2019-02-11 MED ORDER — METRONIDAZOLE 0.75 % VA GEL: 1.0000 | Freq: Two times a day (BID) | VAGINAL | 0 refills | Status: DC

## 2019-02-11 NOTE — Progress Notes (Signed)
We are sorry that you are not feeling well. Here is how we plan to help! Based on what you shared with me it looks like you: May have a vaginosis due to bacteria   Vaginosis is an inflammation of the vagina that can result in discharge, itching and pain. The cause is usually a change in the normal balance of vaginal bacteria or an infection. Vaginosis can also result from reduced estrogen levels after menopause.  The most common causes of vaginosis are:   Bacterial vaginosis which results from an overgrowth of one on several organisms that are normally present in your vagina.   Yeast infections which are caused by a naturally occurring fungus called candida.   Vaginal atrophy (atrophic vaginosis) which results from the thinning of the vagina from reduced estrogen levels after menopause.   Trichomoniasis which is caused by a parasite and is commonly transmitted by sexual intercourse.  Factors that increase your risk of developing vaginosis include: Medications, such as antibiotics and steroids Uncontrolled diabetes Use of hygiene products such as bubble bath, vaginal spray or vaginal deodorant Douching Wearing damp or tight-fitting clothing Using an intrauterine device (IUD) for birth control Hormonal changes, such as those associated with pregnancy, birth control pills or menopause Sexual activity Having a sexually transmitted infection  Your treatment plan is metronidazole gel twice daily for 5 days.  I want you to please find a new gynecologist and make an appointment ASAP.  Post menopausal bleeding should be evaluated by an expert. Please try to make an appointment today along with starting the medication.   Be sure to take all of the medication as directed. Stop taking any medication if you develop a rash, tongue swelling or shortness of breath. Mothers who are breast feeding should consider pumping and discarding their breast milk while on these antibiotics. However, there is no  consensus that infant exposure at these doses would be harmful.  Remember that medication creams can weaken latex condoms. Marland Kitchen   HOME CARE:  Good hygiene may prevent some types of vaginosis from recurring and may relieve some symptoms:  Avoid baths, hot tubs and whirlpool spas. Rinse soap from your outer genital area after a shower, and dry the area well to prevent irritation. Don't use scented or harsh soaps, such as those with deodorant or antibacterial action. Avoid irritants. These include scented tampons and pads. Wipe from front to back after using the toilet. Doing so avoids spreading fecal bacteria to your vagina.  Other things that may help prevent vaginosis include:  Don't douche. Your vagina doesn't require cleansing other than normal bathing. Repetitive douching disrupts the normal organisms that reside in the vagina and can actually increase your risk of vaginal infection. Douching won't clear up a vaginal infection. Use a latex condom. Both female and female latex condoms may help you avoid infections spread by sexual contact. Wear cotton underwear. Also wear pantyhose with a cotton crotch. If you feel comfortable without it, skip wearing underwear to bed. Yeast thrives in [employer] Your symptoms should improve in the next day or two.  GET HELP RIGHT AWAY IF:  You have pain in your lower abdomen ( pelvic area or over your ovaries) You develop nausea or vomiting You develop a fever Your discharge changes or worsens You have persistent pain with intercourse You develop shortness of breath, a rapid pulse, or you faint.  These symptoms could be signs of problems or infections that need to be evaluated by a medical provider now.  MAKE SURE YOU   Understand these instructions. Will watch your condition. Will get help right away if you are not doing well or get worse.  Your e-visit answers were reviewed by a board certified advanced clinical practitioner to complete  your personal care plan. Depending upon the condition, your plan could have included both over the counter or prescription medications. Please review your pharmacy choice to make sure that you have choses a pharmacy that is open for you to pick up any needed prescription, Your safety is important to [place]. If you have drug allergies check your prescription carefully.   You can use MyChart to ask questions about today's visit, request a non-urgent call back, or ask for a work or school excuse for 24 hours related to this e-Visit. If it has been greater than 24 hours you will need to follow up with your provider, or enter a new e-Visit to address those concerns. You will get a MyChart message within the next two days asking about your experience. I hope that your e-visit has been valuable and will speed your recovery.  ===View-only below this line===   ----- Message -----    From: Amy Haynes    Sent: 02/11/2019  3:09 PM EDT      To: E-Visit Mailing List Subject: E-Visit Submission: Vaginal Symptoms  E-Visit Submission: Vaginal Symptoms --------------------------------  Question: Which of the following are you experiencing? Answer:   Vaginal discharge  Question: Are you having pain while passing urine? Answer:   No, I have no pain while urinating  Question: Which of the following applies to your vaginal discharge Answer:   I have a foul-smelling discharge  Question: Which of the following are you experiencing? Answer:   Belly pain  Question: Do you have any sores on your genitals? Answer:   No  Question: Have you taken antibiotics recently? Answer:   I have not been on any antibiotics  Question: Do you do any of the following? Answer:   None of the above  Question: Which of the following applies to your menstrual period? Answer:   I have not had a menstrual period for a while  Question: Have you had similar symptoms in the past? Answer:   No, I have never had  these  symptoms  Question: Please specify what other treatments worked for you in the past Answer:   This was a couple of spots appearing like on my period.  I have  had a period in about 10+ years.  My gynecologist, Dr. Alden Hipp, has recently retired and I have not chosen another or had a gynecological check in over 2 years as I last recall.  Question: Do you have a fever? Answer:   No, I do not have a fever  Question: During the past 2 months, have you had sexual contact with a specific person for the first time? Answer:   No  Question: Has a person with whom you have had sexual contact been recently told they have a disease possibly acquired through sex? Answer:   No  Question: Please list your medication allergies that you may have ? (If 'none' , please list as 'none') Answer:   tramodol  Question: Please list any additional comments  Answer:     A total of 5-10 minutes was spent evaluating this patients questionnaire and formulating a plan of care.

## 2019-03-03 ENCOUNTER — Other Ambulatory Visit: Payer: Self-pay

## 2019-03-03 ENCOUNTER — Ambulatory Visit
Admission: RE | Admit: 2019-03-03 | Discharge: 2019-03-03 | Disposition: A | Discharge status: Home / Self Care | Payer: Medicare Other | Source: Ambulatory Visit | Attending: Internal Medicine | Admitting: Internal Medicine

## 2019-03-03 DIAGNOSIS — Z1231 Encounter for screening mammogram for malignant neoplasm of breast: Secondary | ICD-10-CM

## 2019-03-23 ENCOUNTER — Emergency Department (HOSPITAL_BASED_OUTPATIENT_CLINIC_OR_DEPARTMENT_OTHER): Payer: Medicare Other

## 2019-03-23 ENCOUNTER — Emergency Department (HOSPITAL_BASED_OUTPATIENT_CLINIC_OR_DEPARTMENT_OTHER)
Admission: EM | Admit: 2019-03-23 | Discharge: 2019-03-23 | Disposition: A | Discharge status: Home / Self Care | Payer: Medicare Other | Attending: Emergency Medicine | Admitting: Emergency Medicine

## 2019-03-23 ENCOUNTER — Other Ambulatory Visit: Payer: Self-pay

## 2019-03-23 ENCOUNTER — Encounter (HOSPITAL_BASED_OUTPATIENT_CLINIC_OR_DEPARTMENT_OTHER): Payer: Self-pay | Admitting: *Deleted

## 2019-03-23 DIAGNOSIS — Z7982 Long term (current) use of aspirin: Secondary | ICD-10-CM | POA: Insufficient documentation

## 2019-03-23 DIAGNOSIS — R002 Palpitations: Secondary | ICD-10-CM | POA: Diagnosis not present

## 2019-03-23 DIAGNOSIS — I10 Essential (primary) hypertension: Secondary | ICD-10-CM | POA: Insufficient documentation

## 2019-03-23 DIAGNOSIS — R0789 Other chest pain: Secondary | ICD-10-CM

## 2019-03-23 DIAGNOSIS — R0602 Shortness of breath: Secondary | ICD-10-CM | POA: Insufficient documentation

## 2019-03-23 DIAGNOSIS — Z79899 Other long term (current) drug therapy: Secondary | ICD-10-CM | POA: Insufficient documentation

## 2019-03-23 LAB — CBC WITH DIFFERENTIAL/PLATELET
Abs Immature Granulocytes: 0.01 10*3/uL (ref 0.00–0.07)
Basophils Absolute: 0 10*3/uL (ref 0.0–0.1)
Basophils Relative: 1 %
Eosinophils Absolute: 0 10*3/uL (ref 0.0–0.5)
Eosinophils Relative: 1 %
HCT: 44.9 % (ref 36.0–46.0)
Hemoglobin: 14 g/dL (ref 12.0–15.0)
Immature Granulocytes: 0 %
Lymphocytes Relative: 31 %
Lymphs Abs: 1.4 10*3/uL (ref 0.7–4.0)
MCH: 26 pg (ref 26.0–34.0)
MCHC: 31.2 g/dL (ref 30.0–36.0)
MCV: 83.3 fL (ref 80.0–100.0)
Monocytes Absolute: 0.3 10*3/uL (ref 0.1–1.0)
Monocytes Relative: 8 %
Neutro Abs: 2.5 10*3/uL (ref 1.7–7.7)
Neutrophils Relative %: 59 %
Platelets: 182 10*3/uL (ref 150–400)
RBC: 5.39 MIL/uL — ABNORMAL HIGH (ref 3.87–5.11)
RDW: 14.6 % (ref 11.5–15.5)
WBC: 4.3 10*3/uL (ref 4.0–10.5)
nRBC: 0 % (ref 0.0–0.2)

## 2019-03-23 LAB — BASIC METABOLIC PANEL
Anion gap: 12 (ref 5–15)
BUN: 16 mg/dL (ref 8–23)
CO2: 25 mmol/L (ref 22–32)
Calcium: 9.8 mg/dL (ref 8.9–10.3)
Chloride: 101 mmol/L (ref 98–111)
Creatinine, Ser: 0.77 mg/dL (ref 0.44–1.00)
GFR calc Af Amer: >60 mL/min (ref >60)
GFR calc non Af Amer: >60 mL/min (ref >60)
Glucose, Bld: 95 mg/dL (ref 70–99)
Potassium: 3 mmol/L — ABNORMAL LOW (ref 3.5–5.1)
Sodium: 138 mmol/L (ref 135–145)

## 2019-03-23 LAB — TROPONIN I (HIGH SENSITIVITY): Troponin I (High Sensitivity): 6 ng/L (ref <18)

## 2019-03-23 NOTE — ED Triage Notes (Addendum)
Pt states feeling her heart beating fast, SOB, and pain behind her left shoulder blade this am when she woke around 8am. Denies fever, cough, diarrhea

## 2019-03-23 NOTE — Discharge Instructions (Signed)
Ibuprofen 600 mg every 6 hours as needed for pain.  Follow-up with your primary doctor in the next week, and return to the ER if you develop worsening pain, difficulty breathing, high fever, or other new and concerning symptoms.

## 2019-03-23 NOTE — ED Notes (Signed)
ED Provider at bedside. 

## 2019-03-23 NOTE — ED Provider Notes (Signed)
[facility] EMERGENCY DEPARTMENT Provider Note   CSN: [id number] Arrival date & time: 03/23/19  1302     History   Chief Complaint Chief Complaint  Patient presents with  . Shortness of Breath    HPI Amy Haynes is a 68 y.o. female.     Patient is a 68 year old female with past medical history of hypertension, hyperlipidemia, anxiety presenting with complaints of chest discomfort, palpitations, and shortness of breath.  She reports discomfort behind her left shoulder blade yesterday evening.  This morning she woke not feeling well and short of breath.  She checked her blood pressure and it was in the 563J systolic which is higher than normal.  She also felt as though her heart was beating rapidly.  She denies any recent exertional symptoms.  The history is provided by the patient.  Shortness of Breath Severity:  Moderate Onset quality:  Sudden Duration:  6 hours Timing:  Constant Progression:  Unchanged Chronicity:  New Relieved by:  Nothing Worsened by:  Nothing Ineffective treatments:  None tried   Past Medical History:  Diagnosis Date  . Anxiety   . Diverticulosis of colon   . History of bronchitis   . Hypercholesteremia   . Hypertension   . Microcytosis   . Venous insufficiency     Patient Active Problem List   Diagnosis Date Noted  . Neck pain 12/27/2018  . Osteoarthritis 03/10/2016  . Routine general medical examination at a health care facility 03/22/2015  . HYPERCHOLESTEROLEMIA 08/26/2007  . Essential hypertension 08/26/2007    Past Surgical History:  Procedure Laterality Date  . CESAREAN SECTION     x2  . COLONOSCOPY    . DENTAL SURGERY    . FOOT SURGERY  1991     OB History   No obstetric history on file.      Home Medications    Prior to Admission medications   Medication Sig Start Date End Date Taking? Authorizing Provider  amLODipine-benazepril (LOTREL) 10-20 MG capsule take 1 capsule by mouth once daily 07/18/18    Hoyt Koch, MD  aspirin 81 MG tablet Take 81 mg by mouth daily.    [provider]  cholecalciferol (VITAMIN D) 1000 UNITS tablet Take 1,000 Units by mouth daily.    [provider]  metroNIDAZOLE (METROGEL VAGINAL) 0.75 % vaginal gel Place 1 Applicatorful vaginally 2 (two) times daily. 02/11/19   Tereasa Coop, PA-C  Multiple Vitamins-Minerals (MULTIVITAMIN & MINERAL PO) Take 1 tablet by mouth daily.    [provider]  simvastatin (ZOCOR) 20 MG tablet take 1 tablet by mouth every evening 07/18/18   Hoyt Koch, MD  triamterene-hydrochlorothiazide (MAXZIDE-25) 37.5-25 MG tablet Take 1 tablet by mouth daily. 07/18/18   Hoyt Koch, MD    Family History Family History  Problem Relation Age of Onset  . Arthritis Mother   . Hypertension Mother   . Cancer Mother 17       breast  . Kidney disease Sister   . Diabetes Sister   . Heart disease Sister   . Colon cancer Neg Hx   . Colon polyps Neg Hx   . Esophageal cancer Neg Hx   . Rectal cancer Neg Hx   . Stomach cancer Neg Hx     Social History Social History   Tobacco Use  . Smoking status: Never Smoker  . Smokeless tobacco: Never Used  Substance Use Topics  . Alcohol use: Yes    Comment:  social use  . Drug use: No     Allergies   Tramadol   Review of Systems Review of Systems  Respiratory: Positive for shortness of breath.   All other systems reviewed and are negative.    Physical Exam Updated Vital Signs BP (!) 158/94 (BP Location: Right Arm)   Pulse 82   Temp 97.7 F (36.5 C) (Oral)   Resp 18   Ht 5\' 3"  (1.6 m)   Wt 71.7 kg   BMI 27.99 kg/m   Physical Exam Vitals signs and nursing note reviewed.  Constitutional:      General: She is not in acute distress.    Appearance: She is well-developed. She is not diaphoretic.  HENT:     Head: Normocephalic and atraumatic.  Neck:     Musculoskeletal: Normal range of motion and neck supple.   Cardiovascular:     Rate and Rhythm: Normal rate and regular rhythm.     Heart sounds: No murmur. No friction rub. No gallop.   Pulmonary:     Effort: Pulmonary effort is normal. No respiratory distress.     Breath sounds: Normal breath sounds. No wheezing.  Abdominal:     General: Bowel sounds are normal. There is no distension.     Palpations: Abdomen is soft.     Tenderness: There is no abdominal tenderness.  Musculoskeletal: Normal range of motion.     Right lower leg: She exhibits no tenderness. No edema.     Left lower leg: She exhibits no tenderness. No edema.  Skin:    General: Skin is warm and dry.  Neurological:     Mental Status: She is alert and oriented to person, place, and time.      ED Treatments / Results  Labs (all labs ordered are listed, but only abnormal results are displayed) Labs Reviewed  BASIC METABOLIC PANEL  TROPONIN I (HIGH SENSITIVITY)  TROPONIN I (HIGH SENSITIVITY)  CBC WITH DIFFERENTIAL/PLATELET    EKG EKG Interpretation  Date/Time:  Sunday March 23 2019 13:22:02 EDT Ventricular Rate:  58 PR Interval:    QRS Duration: 106 QT Interval:  466 QTC Calculation: 458 R Axis:   66 Text Interpretation:  Sinus rhythm Baseline wander in lead(s) III Confirmed by Veryl Speak [phone]) on 03/23/2019 1:33:08 PM   Radiology No results found.  Procedures Procedures (including critical care time)  Medications Ordered in ED Medications - No data to display   Initial Impression / Assessment and Plan / ED Course  I have reviewed the triage vital signs and the nursing notes.  Pertinent labs & imaging results that were available during my care of the patient were reviewed by me and considered in my medical decision making (see chart for details).  Patient with history of hypertension, hyperlipidemia, and anxiety presenting with complaints of chest discomfort palpitations and shortness of breath.  The chest discomfort was behind her left shoulder blade  and this occurred yesterday evening.  This morning she felt as though her heart was racing and presents for evaluation.  Work-up reveals an unchanged EKG and negative troponin.  Chest x-ray shows possible nodule in the left lung.  This was followed up with a chest CT which shows what appear to be a bone island in 1 of the left ribs, but no pulmonary nodules.  I doubt a cardiac etiology.  I also doubt pulmonary embolism.  Her heart rate is in the 60s and oxygen saturations are 100%.  At this point, I feel  as though patient is appropriate for discharge with PRN return.  Final Clinical Impressions(s) / ED Diagnoses   Final diagnoses:  None    ED Discharge Orders    None       Veryl Speak, MD 03/26/19 2320

## 2019-03-23 NOTE — ED Notes (Signed)
Patient transported to X-ray 

## 2019-07-17 ENCOUNTER — Other Ambulatory Visit: Payer: Self-pay

## 2019-07-17 DIAGNOSIS — I10 Essential (primary) hypertension: Secondary | ICD-10-CM

## 2019-07-17 DIAGNOSIS — E7800 Pure hypercholesterolemia, unspecified: Secondary | ICD-10-CM

## 2019-07-17 MED ORDER — SIMVASTATIN 20 MG PO TABS: ORAL_TABLET | ORAL | 0 refills | Status: DC

## 2019-07-17 MED ORDER — AMLODIPINE BESY-BENAZEPRIL HCL 10-20 MG PO CAPS: ORAL_CAPSULE | ORAL | 0 refills | Status: DC

## 2019-07-25 ENCOUNTER — Other Ambulatory Visit: Payer: Self-pay

## 2019-07-25 ENCOUNTER — Other Ambulatory Visit (INDEPENDENT_AMBULATORY_CARE_PROVIDER_SITE_OTHER): Payer: Medicare Other

## 2019-07-25 ENCOUNTER — Encounter: Payer: Self-pay | Admitting: Internal Medicine

## 2019-07-25 ENCOUNTER — Ambulatory Visit (INDEPENDENT_AMBULATORY_CARE_PROVIDER_SITE_OTHER): Payer: Medicare Other | Admitting: Internal Medicine

## 2019-07-25 VITALS — BP 122/90 | HR 65 | Temp 98.6°F | Ht 63.0 in | Wt 174.0 lb

## 2019-07-25 DIAGNOSIS — E7800 Pure hypercholesterolemia, unspecified: Secondary | ICD-10-CM

## 2019-07-25 DIAGNOSIS — Z23 Encounter for immunization: Secondary | ICD-10-CM

## 2019-07-25 DIAGNOSIS — Z0000 Encounter for general adult medical examination without abnormal findings: Secondary | ICD-10-CM

## 2019-07-25 DIAGNOSIS — I10 Essential (primary) hypertension: Secondary | ICD-10-CM | POA: Diagnosis not present

## 2019-07-25 LAB — COMPREHENSIVE METABOLIC PANEL
ALT: 26 U/L (ref 0–35)
AST: 23 U/L (ref 0–37)
Albumin: 4.2 g/dL (ref 3.5–5.2)
Alkaline Phosphatase: 70 U/L (ref 39–117)
BUN: 13 mg/dL (ref 6–23)
CO2: 30 mEq/L (ref 19–32)
Calcium: 9.5 mg/dL (ref 8.4–10.5)
Chloride: 103 mEq/L (ref 96–112)
Creatinine, Ser: 0.7 mg/dL (ref 0.40–1.20)
GFR: 100.85 mL/min (ref >60.00)
Glucose, Bld: 90 mg/dL (ref 70–99)
Potassium: 3.4 mEq/L — ABNORMAL LOW (ref 3.5–5.1)
Sodium: 140 mEq/L (ref 135–145)
Total Bilirubin: 0.7 mg/dL (ref 0.2–1.2)
Total Protein: 7.9 g/dL (ref 6.0–8.3)

## 2019-07-25 LAB — CBC
HCT: 40.8 % (ref 36.0–46.0)
Hemoglobin: 13.2 g/dL (ref 12.0–15.0)
MCHC: 32.4 g/dL (ref 30.0–36.0)
MCV: 81.8 fl (ref 78.0–100.0)
Platelets: 162 10*3/uL (ref 150.0–400.0)
RBC: 4.98 Mil/uL (ref 3.87–5.11)
RDW: 14.6 % (ref 11.5–15.5)
WBC: 3.6 10*3/uL — ABNORMAL LOW (ref 4.0–10.5)

## 2019-07-25 LAB — HEMOGLOBIN A1C: Hgb A1c MFr Bld: 5.8 % (ref 4.6–6.5)

## 2019-07-25 LAB — LIPID PANEL
Cholesterol: 157 mg/dL (ref 0–200)
HDL: 63 mg/dL (ref >39.00)
LDL Cholesterol: 79 mg/dL (ref 0–99)
NonHDL: 93.63
Total CHOL/HDL Ratio: 2
Triglycerides: 71 mg/dL (ref 0.0–149.0)
VLDL: 14.2 mg/dL (ref 0.0–40.0)

## 2019-07-25 NOTE — Assessment & Plan Note (Signed)
Flu shot given. Pneumonia complete. Shingrix counseled. Tetanus due 2025. Colonoscopy due 2020 counseled. Mammogram due 2022, pap smear aged out and dexa due declines. Counseled about sun safety and mole surveillance. Counseled about the dangers of distracted driving. Given 10 year screening recommendations.

## 2019-07-25 NOTE — Assessment & Plan Note (Signed)
Checking lipid panel and adjust simvastatin as needed.  

## 2019-07-25 NOTE — Patient Instructions (Signed)
Make sure to get the colonoscopy done when you feel safe.   The shingles vaccine shingrix is an option.    Health Maintenance, Female Adopting a healthy lifestyle and getting preventive care are important in promoting health and wellness. Ask your health care provider about:  The right schedule for you to have regular tests and exams.  Things you can do on your own to prevent diseases and keep yourself healthy. What should I know about diet, weight, and exercise? Eat a healthy diet   Eat a diet that includes plenty of vegetables, fruits, low-fat dairy products, and lean protein.  Do not eat a lot of foods that are high in solid fats, added sugars, or sodium. Maintain a healthy weight Body mass index (BMI) is used to identify weight problems. It estimates body fat based on height and weight. Your health care provider can help determine your BMI and help you achieve or maintain a healthy weight. Get regular exercise Get regular exercise. This is one of the most important things you can do for your health. Most adults should:  Exercise for at least 150 minutes each week. The exercise should increase your heart rate and make you sweat (moderate-intensity exercise).  Do strengthening exercises at least twice a week. This is in addition to the moderate-intensity exercise.  Spend less time sitting. Even light physical activity can be beneficial. Watch cholesterol and blood lipids Have your blood tested for lipids and cholesterol at 67 years of age, then have this test every 5 years. Have your cholesterol levels checked more often if:  Your lipid or cholesterol levels are high.  You are older than 67 years of age.  You are at high risk for heart disease. What should I know about cancer screening? Depending on your health history and family history, you may need to have cancer screening at various ages. This may include screening for:  Breast cancer.  Cervical cancer.  Colorectal  cancer.  Skin cancer.  Lung cancer. What should I know about heart disease, diabetes, and high blood pressure? Blood pressure and heart disease  High blood pressure causes heart disease and increases the risk of stroke. This is more likely to develop in people who have high blood pressure readings, are of African descent, or are overweight.  Have your blood pressure checked: ? Every 3-5 years if you are 67-52 years of age. ? Every year if you are 25 years old or older. Diabetes Have regular diabetes screenings. This checks your fasting blood sugar level. Have the screening done:  Once every three years after age 70 if you are at a normal weight and have a low risk for diabetes.  More often and at a younger age if you are overweight or have a high risk for diabetes. What should I know about preventing infection? Hepatitis B If you have a higher risk for hepatitis B, you should be screened for this virus. Talk with your health care provider to find out if you are at risk for hepatitis B infection. Hepatitis C Testing is recommended for:  Everyone born from [birth year] through 1964.  Anyone with known risk factors for hepatitis C. Sexually transmitted infections (STIs)  Get screened for STIs, including gonorrhea and chlamydia, if: ? You are sexually active and are younger than 67 years of age. ? You are older than 67 years of age and your health care provider tells you that you are at risk for this type of infection. ? Your sexual activity  has changed since you were last screened, and you are at increased risk for chlamydia or gonorrhea. Ask your health care provider if you are at risk.  Ask your health care provider about whether you are at high risk for HIV. Your health care provider may recommend a prescription medicine to help prevent HIV infection. If you choose to take medicine to prevent HIV, you should first get tested for HIV. You should then be tested every 3 months for as long as  you are taking the medicine. Pregnancy  If you are about to stop having your period (premenopausal) and you may become pregnant, seek counseling before you get pregnant.  Take 400 to 800 micrograms (mcg) of folic acid every day if you become pregnant.  Ask for birth control (contraception) if you want to prevent pregnancy. Osteoporosis and menopause Osteoporosis is a disease in which the bones lose minerals and strength with aging. This can result in bone fractures. If you are 60 years old or older, or if you are at risk for osteoporosis and fractures, ask your health care provider if you should:  Be screened for bone loss.  Take a calcium or vitamin D supplement to lower your risk of fractures.  Be given hormone replacement therapy (HRT) to treat symptoms of menopause. Follow these instructions at home: Lifestyle  Do not use any products that contain nicotine or tobacco, such as cigarettes, e-cigarettes, and chewing tobacco. If you need help quitting, ask your health care provider.  Do not use street drugs.  Do not share needles.  Ask your health care provider for help if you need support or information about quitting drugs. Alcohol use  Do not drink alcohol if: ? Your health care provider tells you not to drink. ? You are pregnant, may be pregnant, or are planning to become pregnant.  If you drink alcohol: ? Limit how much you use to 0-1 drink a day. ? Limit intake if you are breastfeeding.  Be aware of how much alcohol is in your drink. In the [place] one drink equals one 12 oz bottle of beer (355 mL), one 5 oz glass of wine (148 mL), or one 1 oz glass of hard liquor (44 mL). General instructions  Schedule regular health, dental, and eye exams.  Stay current with your vaccines.  Tell your health care provider if: ? You often feel depressed. ? You have ever been abused or do not feel safe at home. Summary  Adopting a healthy lifestyle and getting preventive care are  important in promoting health and wellness.  Follow your health care provider's instructions about healthy diet, exercising, and getting tested or screened for diseases.  Follow your health care provider's instructions on monitoring your cholesterol and blood pressure. This information is not intended to replace advice given to you by your health care provider. Make sure you discuss any questions you have with your health care provider. Document Released: 03/27/2011 Document Revised: 09/04/2018 Document Reviewed: 09/04/2018 Elsevier Patient Education  2020 [employer].

## 2019-07-25 NOTE — Progress Notes (Signed)
Subjective:   Patient ID: Amy Haynes, female    DOB: [birth year], 68 y.o.   MRN: [id number]  HPI Here for medicare wellness and physical, no new complaints. Please see A/P for status and treatment of chronic medical problems.   Diet: heart healthy Physical activity: active Depression/mood screen: negative Hearing: intact to whispered voice Visual acuity: grossly normal, performs annual eye exam  ADLs: capable Fall risk: none Home safety: good Cognitive evaluation: intact to orientation, naming, recall and repetition EOL planning: adv directives discussed    Office Visit from 07/25/2019 in [facility]  PHQ-2 Total Score  1      I have personally reviewed and have noted 1. The patient's medical and social history - reviewed today no changes 2. Their use of alcohol, tobacco or illicit drugs 3. Their current medications and supplements 4. The patient's functional ability including ADL's, fall risks, home safety risks and hearing or visual impairment. 5. Diet and physical activities 6. Evidence for depression or mood disorders 7. Care team reviewed and updated  Patient Care Team: Hoyt Koch, MD as PCP - General (Internal Medicine) Past Medical History:  Diagnosis Date  . Anxiety   . Diverticulosis of colon   . History of bronchitis   . Hypercholesteremia   . Hypertension   . Microcytosis   . Venous insufficiency    Past Surgical History:  Procedure Laterality Date  . CESAREAN SECTION     x2  . COLONOSCOPY    . DENTAL SURGERY    . FOOT SURGERY  1991   Family History  Problem Relation Age of Onset  . Arthritis Mother   . Hypertension Mother   . Cancer Mother 84       breast  . Kidney disease Sister   . Diabetes Sister   . Heart disease Sister   . Colon cancer Neg Hx   . Colon polyps Neg Hx   . Esophageal cancer Neg Hx   . Rectal cancer Neg Hx   . Stomach cancer Neg Hx     Review of Systems  Constitutional: Negative.    HENT: Negative.   Eyes: Negative.   Respiratory: Negative for cough, chest tightness and shortness of breath.   Cardiovascular: Negative for chest pain, palpitations and leg swelling.  Gastrointestinal: Negative for abdominal distention, abdominal pain, constipation, diarrhea, nausea and vomiting.  Musculoskeletal: Negative.   Skin: Negative.   Neurological: Negative.   Psychiatric/Behavioral: Negative.     Objective:  Physical Exam Constitutional:      Appearance: She is well-developed.  HENT:     Head: Normocephalic and atraumatic.  Neck:     Musculoskeletal: Normal range of motion.  Cardiovascular:     Rate and Rhythm: Normal rate and regular rhythm.  Pulmonary:     Effort: Pulmonary effort is normal. No respiratory distress.     Breath sounds: Normal breath sounds. No wheezing or rales.  Abdominal:     General: Bowel sounds are normal. There is no distension.     Palpations: Abdomen is soft.     Tenderness: There is no abdominal tenderness. There is no rebound.  Skin:    General: Skin is warm and dry.  Neurological:     Mental Status: She is alert and oriented to person, place, and time.     Coordination: Coordination normal.     Vitals:   07/25/19 0926  BP: 122/90  Pulse: 65  Temp: 98.6 F (37 C)  TempSrc:  Oral  SpO2: 98%  Weight: 174 lb (78.9 kg)  Height: 5\' 3"  (1.6 m)    Assessment & Plan:  Flu shot given at visit

## 2019-07-25 NOTE — Assessment & Plan Note (Signed)
BP at goal, checking CMP and adjust amlodipine/benazepril and hctz as needed.

## 2019-08-20 ENCOUNTER — Other Ambulatory Visit: Payer: Self-pay | Admitting: *Deleted

## 2019-08-20 DIAGNOSIS — I10 Essential (primary) hypertension: Secondary | ICD-10-CM

## 2019-08-20 MED ORDER — TRIAMTERENE-HCTZ 37.5-25 MG PO TABS: 1.0000 | ORAL_TABLET | Freq: Every day | ORAL | 3 refills | Status: DC

## 2019-08-27 ENCOUNTER — Encounter: Payer: Self-pay | Admitting: Internal Medicine

## 2019-08-28 ENCOUNTER — Encounter: Payer: Self-pay | Admitting: Internal Medicine

## 2019-09-05 ENCOUNTER — Other Ambulatory Visit: Payer: Self-pay

## 2019-09-05 ENCOUNTER — Ambulatory Visit (AMBULATORY_SURGERY_CENTER): Payer: Medicare Other | Admitting: *Deleted

## 2019-09-05 VITALS — Temp 97.3°F | Ht 63.0 in | Wt 173.0 lb

## 2019-09-05 DIAGNOSIS — Z1159 Encounter for screening for other viral diseases: Secondary | ICD-10-CM

## 2019-09-05 DIAGNOSIS — Z86010 Personal history of colonic polyps: Secondary | ICD-10-CM

## 2019-09-05 MED ORDER — SUPREP BOWEL PREP KIT 17.5-3.13-1.6 GM/177ML PO SOLN: 1.0000 | Freq: Once | ORAL | 0 refills | Status: AC

## 2019-09-05 NOTE — Progress Notes (Signed)
No egg or soy allergy known to patient  No issues with past sedation with any surgeries  or procedures, no intubation problems  No diet pills per patient No home 02 use per patient  No blood thinners per patient  Pt denies issues with constipation that are chronic- is occ an issue bur not daily  No A fib or A flutter  EMMI video sent to pt's e mail   Due to the COVID-19 pandemic we are asking patients to follow these guidelines. Please only bring one care partner. Please be aware that your care partner may wait in the car in the parking lot or if they feel like they will be too hot to wait in the car, they may wait in the lobby on the 4th floor. All care partners are required to wear a mask the entire time (we do not have any that we can provide them), they need to practice social distancing, and we will do a Covid check for all patient's and care partners when you arrive. Also we will check their temperature and your temperature. If the care partner waits in their car they need to stay in the parking lot the entire time and we will call them on their cell phone when the patient is ready for discharge so they can bring the car to the front of the building. Also all patient's will need to wear a mask into building.

## 2019-09-08 ENCOUNTER — Encounter: Payer: Self-pay | Admitting: Internal Medicine

## 2019-09-11 ENCOUNTER — Ambulatory Visit (INDEPENDENT_AMBULATORY_CARE_PROVIDER_SITE_OTHER): Payer: Medicare Other

## 2019-09-11 DIAGNOSIS — Z1159 Encounter for screening for other viral diseases: Secondary | ICD-10-CM

## 2019-09-11 LAB — SARS CORONAVIRUS 2 (TAT 6-24 HRS): SARS Coronavirus 2: NEGATIVE

## 2019-09-16 ENCOUNTER — Ambulatory Visit (AMBULATORY_SURGERY_CENTER): Payer: Medicare Other | Admitting: Internal Medicine

## 2019-09-16 ENCOUNTER — Encounter: Payer: Self-pay | Admitting: Internal Medicine

## 2019-09-16 ENCOUNTER — Other Ambulatory Visit: Payer: Self-pay

## 2019-09-16 VITALS — BP 110/69 | HR 67 | Temp 98.2°F | Resp 15 | Ht 63.0 in | Wt 173.0 lb

## 2019-09-16 DIAGNOSIS — Z860100 Personal history of colon polyps, unspecified: Secondary | ICD-10-CM

## 2019-09-16 MED ORDER — SODIUM CHLORIDE 0.9 % IV SOLN: 500.0000 mL | Freq: Once | INTRAVENOUS | Status: DC

## 2019-09-16 NOTE — Op Note (Signed)
[facility] Patient Name: Amy Haynes Procedure Date: 09/16/2019 2:17 PM MRN: [id number] Endoscopist: Docia Chuck. Henrene Pastor , MD Age: 68 Referring MD:  Date of Birth: 31-Aug-1951 Gender: Female Account #: [id number] Procedure:                Colonoscopy Indications:              High risk colon cancer surveillance: Personal                            history of multiple (3 or more) adenomas. Index                            examination 2007 negative for neoplasia. Subsequent                            examination 2017 Medicines:                Monitored Anesthesia Care Procedure:                Pre-Anesthesia Assessment:                           - Prior to the procedure, a History and Physical                            was performed, and patient medications and                            allergies were reviewed. The patient's tolerance of                            previous anesthesia was also reviewed. The risks                            and benefits of the procedure and the sedation                            options and risks were discussed with the patient.                            All questions were answered, and informed consent                            was obtained. Prior Anticoagulants: The patient has                            taken no previous anticoagulant or antiplatelet                            agents. After reviewing the risks and benefits, the                            patient was deemed in satisfactory condition to  undergo the procedure.                           After obtaining informed consent, the colonoscope                            was passed under direct vision. Throughout the                            procedure, the patient's blood pressure, pulse, and                            oxygen saturations were monitored continuously. The                            Colonoscope was introduced through the anus and              advanced to the the cecum, identified by                            appendiceal orifice and ileocecal valve. The                            ileocecal valve, appendiceal orifice, and rectum                            were photographed. The quality of the bowel                            preparation was excellent. The colonoscopy was                            performed without difficulty. The patient tolerated                            the procedure well. The bowel preparation used was                            SUPREP via split dose instruction. Scope In: 2:23:55 PM Scope Out: 2:36:58 PM Scope Withdrawal Time: 0 hours 9 minutes 25 seconds  Total Procedure Duration: 0 hours 13 minutes 3 seconds  Findings:                 A few small-mouthed diverticula were found in the                            right colon.                           The exam was otherwise without abnormality on                            direct and retroflexion views. Complications:            No immediate complications. Estimated blood loss:  None. Estimated Blood Loss:     Estimated blood loss: none. Impression:               - Diverticulosis in the right colon.                           - The examination was otherwise normal on direct                            and retroflexion views.                           - No specimens collected. Recommendation:           - Repeat colonoscopy in 5 years for surveillance                            (personal history of multiple adenomas).                           - Patient has a contact number available for                            emergencies. The signs and symptoms of potential                            delayed complications were discussed with the                            patient. Return to normal activities tomorrow.                            Written discharge instructions were provided to the                            patient.                            - Resume previous diet.                           - Continue present medications. Docia Chuck. Henrene Pastor, MD 09/16/2019 2:41:17 PM This report has been signed electronically.

## 2019-09-16 NOTE — Patient Instructions (Signed)
Handouts given for Diverticulosis.  YOU HAD AN ENDOSCOPIC PROCEDURE TODAY AT [facility]:   Refer to the procedure report that was given to you for any specific questions about what was found during the examination.  If the procedure report does not answer your questions, please call your gastroenterologist to clarify.  If you requested that your care partner not be given the details of your procedure findings, then the procedure report has been included in a sealed envelope for you to review at your convenience later.  YOU SHOULD EXPECT: Some feelings of bloating in the abdomen. Passage of more gas than usual.  Walking can help get rid of the air that was put into your GI tract during the procedure and reduce the bloating. If you had a lower endoscopy (such as a colonoscopy or flexible sigmoidoscopy) you may notice spotting of blood in your stool or on the toilet paper. If you underwent a bowel prep for your procedure, you may not have a normal bowel movement for a few days.  Please Note:  You might notice some irritation and congestion in your nose or some drainage.  This is from the oxygen used during your procedure.  There is no need for concern and it should clear up in a day or so.  SYMPTOMS TO REPORT IMMEDIATELY:   Following lower endoscopy (colonoscopy or flexible sigmoidoscopy):  Excessive amounts of blood in the stool  Significant tenderness or worsening of abdominal pains  Swelling of the abdomen that is new, acute  Fever of 100F or higher   For urgent or emergent issues, a gastroenterologist can be reached at any hour by calling [phone].   DIET:  We do recommend a small meal at first, but then you may proceed to your regular diet.  Drink plenty of fluids but you should avoid alcoholic beverages for 24 hours.  ACTIVITY:  You should plan to take it easy for the rest of today and you should NOT DRIVE or use heavy machinery until tomorrow (because of the  sedation medicines used during the test).    FOLLOW UP: Our staff will call the number listed on your records 48-72 hours following your procedure to check on you and address any questions or concerns that you may have regarding the information given to you following your procedure. If we do not reach you, we will leave a message.  We will attempt to reach you two times.  During this call, we will ask if you have developed any symptoms of COVID 19. If you develop any symptoms (ie: fever, flu-like symptoms, shortness of breath, cough etc.) before then, please call [phone].  If you test positive for Covid 19 in the 2 weeks post procedure, please call and report this information to [place].    If any biopsies were taken you will be contacted by phone or by letter within the next 1-3 weeks.  Please call us at [phone] if you have not heard about the biopsies in 3 weeks.    SIGNATURES/CONFIDENTIALITY: You and/or your care partner have signed paperwork which will be entered into your electronic medical record.  These signatures attest to the fact that that the information above on your After Visit Summary has been reviewed and is understood.  Full responsibility of the confidentiality of this discharge information lies with you and/or your care-partner.

## 2019-09-16 NOTE — Progress Notes (Signed)
Report to PACU, RN, vss, BBS= Clear.  

## 2019-09-18 ENCOUNTER — Telehealth: Payer: Self-pay | Admitting: *Deleted

## 2019-09-18 NOTE — Telephone Encounter (Signed)
  Follow up Call-  Call back number 09/16/2019  Post procedure Call Back phone  # [phone]  Permission to leave phone message Yes  Some recent data might be hidden     Patient questions:  Do you have a fever, pain , or abdominal swelling? No. Pain Score  0 *  Have you tolerated food without any problems? Yes.    Have you been able to return to your normal activities? Yes.    Do you have any questions about your discharge instructions: Diet   No. Medications  No. Follow up visit  No.  Do you have questions or concerns about your Care? No.  Actions: * If pain score is 4 or above: 1. No action needed, pain <4.Have you developed a fever since your procedure? no  2.   Have you had an respiratory symptoms (SOB or cough) since your procedure? no  3.   Have you tested positive for COVID 19 since your procedure no  4.   Have you had any family members/close contacts diagnosed with the COVID 19 since your procedure?  no   If yes to any of these questions please route to Joylene John, RN and Alphonsa Gin, Therapist, sports.

## 2019-10-12 ENCOUNTER — Other Ambulatory Visit: Payer: Self-pay | Admitting: Internal Medicine

## 2019-10-12 DIAGNOSIS — E7800 Pure hypercholesterolemia, unspecified: Secondary | ICD-10-CM

## 2019-10-16 ENCOUNTER — Other Ambulatory Visit: Payer: Self-pay | Admitting: Internal Medicine

## 2019-10-16 DIAGNOSIS — I10 Essential (primary) hypertension: Secondary | ICD-10-CM

## 2019-11-22 ENCOUNTER — Other Ambulatory Visit: Payer: Self-pay | Admitting: Internal Medicine

## 2019-11-22 DIAGNOSIS — I10 Essential (primary) hypertension: Secondary | ICD-10-CM

## 2020-01-16 ENCOUNTER — Other Ambulatory Visit: Payer: Self-pay | Admitting: Internal Medicine

## 2020-01-16 DIAGNOSIS — I10 Essential (primary) hypertension: Secondary | ICD-10-CM

## 2020-01-21 ENCOUNTER — Other Ambulatory Visit: Payer: Self-pay | Admitting: Internal Medicine

## 2020-01-21 DIAGNOSIS — Z1231 Encounter for screening mammogram for malignant neoplasm of breast: Secondary | ICD-10-CM

## 2020-01-26 ENCOUNTER — Encounter: Payer: Self-pay | Admitting: Internal Medicine

## 2020-01-26 DIAGNOSIS — L989 Disorder of the skin and subcutaneous tissue, unspecified: Secondary | ICD-10-CM

## 2020-02-06 DIAGNOSIS — L720 Epidermal cyst: Secondary | ICD-10-CM | POA: Diagnosis not present

## 2020-02-25 ENCOUNTER — Ambulatory Visit (INDEPENDENT_AMBULATORY_CARE_PROVIDER_SITE_OTHER): Payer: Medicare PPO | Admitting: Internal Medicine

## 2020-02-25 ENCOUNTER — Other Ambulatory Visit: Payer: Self-pay

## 2020-02-25 ENCOUNTER — Encounter: Payer: Self-pay | Admitting: Internal Medicine

## 2020-02-25 VITALS — BP 136/78 | HR 50 | Temp 98.1°F | Ht 63.0 in | Wt 176.2 lb

## 2020-02-25 DIAGNOSIS — R233 Spontaneous ecchymoses: Secondary | ICD-10-CM | POA: Insufficient documentation

## 2020-02-25 DIAGNOSIS — R0789 Other chest pain: Secondary | ICD-10-CM | POA: Insufficient documentation

## 2020-02-25 DIAGNOSIS — R238 Other skin changes: Secondary | ICD-10-CM

## 2020-02-25 LAB — COMPREHENSIVE METABOLIC PANEL
ALT: 26 U/L (ref 0–35)
AST: 24 U/L (ref 0–37)
Albumin: 4.5 g/dL (ref 3.5–5.2)
Alkaline Phosphatase: 73 U/L (ref 39–117)
BUN: 14 mg/dL (ref 6–23)
CO2: 31 mEq/L (ref 19–32)
Calcium: 9.9 mg/dL (ref 8.4–10.5)
Chloride: 101 mEq/L (ref 96–112)
Creatinine, Ser: 0.75 mg/dL (ref 0.40–1.20)
GFR: 92.96 mL/min (ref >60.00)
Glucose, Bld: 95 mg/dL (ref 70–99)
Potassium: 3.6 mEq/L (ref 3.5–5.1)
Sodium: 138 mEq/L (ref 135–145)
Total Bilirubin: 0.8 mg/dL (ref 0.2–1.2)
Total Protein: 8.6 g/dL — ABNORMAL HIGH (ref 6.0–8.3)

## 2020-02-25 LAB — CBC WITH DIFFERENTIAL/PLATELET
Basophils Absolute: 0 10*3/uL (ref 0.0–0.1)
Basophils Relative: 0.6 % (ref 0.0–3.0)
Eosinophils Absolute: 0 10*3/uL (ref 0.0–0.7)
Eosinophils Relative: 0.9 % (ref 0.0–5.0)
HCT: 42.5 % (ref 36.0–46.0)
Hemoglobin: 13.9 g/dL (ref 12.0–15.0)
Lymphocytes Relative: 32.4 % (ref 12.0–46.0)
Lymphs Abs: 1.4 10*3/uL (ref 0.7–4.0)
MCHC: 32.6 g/dL (ref 30.0–36.0)
MCV: 81.2 fl (ref 78.0–100.0)
Monocytes Absolute: 0.4 10*3/uL (ref 0.1–1.0)
Monocytes Relative: 9.2 % (ref 3.0–12.0)
Neutro Abs: 2.4 10*3/uL (ref 1.4–7.7)
Neutrophils Relative %: 56.9 % (ref 43.0–77.0)
Platelets: 182 10*3/uL (ref 150.0–400.0)
RBC: 5.23 Mil/uL — ABNORMAL HIGH (ref 3.87–5.11)
RDW: 14 % (ref 11.5–15.5)
WBC: 4.2 10*3/uL (ref 4.0–10.5)

## 2020-02-25 LAB — PROTIME-INR
INR: 1.1 ratio — ABNORMAL HIGH (ref 0.8–1.0)
Prothrombin Time: 11.9 s (ref 9.6–13.1)

## 2020-02-25 LAB — TSH: TSH: 0.58 u[IU]/mL (ref 0.35–4.50)

## 2020-02-25 LAB — APTT: aPTT: 29.8 s (ref 23.4–32.7)

## 2020-02-25 LAB — HEMOGLOBIN A1C: Hgb A1c MFr Bld: 6 % (ref 4.6–6.5)

## 2020-02-25 LAB — VITAMIN D 25 HYDROXY (VIT D DEFICIENCY, FRACTURES): VITD: 53.43 ng/mL (ref 30.00–100.00)

## 2020-02-25 LAB — VITAMIN B12: Vitamin B-12: 1193 pg/mL — ABNORMAL HIGH (ref 211–911)

## 2020-02-25 NOTE — Patient Instructions (Signed)
We will do the blood work today and then get a stress test if all labs are normal.

## 2020-02-25 NOTE — Assessment & Plan Note (Addendum)
Rare episodes with normal EKG previously. Ordered stress test to assess.

## 2020-02-25 NOTE — Progress Notes (Signed)
   Subjective:   Patient ID: Amy Haynes, female    DOB: [birth year], 69 y.o.   MRN: [id number]  HPI The patient is a 69 YO female coming in for left leg bruise. Noticed it about 2-3 weeks ago with some pain. Denies injury to the area. Overall it is getting some better but not going away. Denies swelling in the area. Has tried nothing. Does seem to bruise often. Takes aspirin 81 mg daily. Denies joint swelling. Is also still having rare episodes of chest discomfort. Seen at ER last summer with normal EKG and labs. Is concerned as there is heart disease in her family. She denies getting these with exertion. They are random and last only a few minutes. Did have one episode where she felt clammy at the same time. Pressure stays in the chest not into the arms or jaw. She is taking aspirin 81 mg daily.   Review of Systems  Constitutional: Negative.   HENT: Negative.   Eyes: Negative.   Respiratory: Positive for chest tightness. Negative for cough and shortness of breath.   Cardiovascular: Positive for chest pain. Negative for palpitations and leg swelling.  Gastrointestinal: Negative for abdominal distention, abdominal pain, constipation, diarrhea, nausea and vomiting.  Musculoskeletal: Negative.   Skin: Negative.   Neurological: Negative.   Hematological: Bruises/bleeds easily.  Psychiatric/Behavioral: Negative.     Objective:  Physical Exam Constitutional:      Appearance: She is well-developed.  HENT:     Head: Normocephalic and atraumatic.  Cardiovascular:     Rate and Rhythm: Normal rate and regular rhythm.  Pulmonary:     Effort: Pulmonary effort is normal. No respiratory distress.     Breath sounds: Normal breath sounds. No wheezing or rales.  Abdominal:     General: Bowel sounds are normal. There is no distension.     Palpations: Abdomen is soft.     Tenderness: There is no abdominal tenderness. There is no rebound.  Musculoskeletal:     Cervical back: Normal range of motion.    Skin:    General: Skin is warm and dry.     Comments: Bruise about 3 cm circular on the left lower leg which is clear in the center, without signs of infection. A couple small bruises on the upper arms.   Neurological:     Mental Status: She is alert and oriented to person, place, and time.     Coordination: Coordination normal.    Vitals:   02/25/20 1012  BP: 136/78  Pulse: (!) 50  Temp: 98.1 F (36.7 C)  SpO2: 99%  Weight: 176 lb 3.2 oz (79.9 kg)  Height: 5\' 3"  (1.6 m)   This visit occurred during the SARS-CoV-2 public health emergency.  Safety protocols were in place, including screening questions prior to the visit, additional usage of staff PPE, and extensive cleaning of exam room while observing appropriate contact time as indicated for disinfecting solutions.   Assessment & Plan:  Visit time 20 minutes in face to face communication with patient and coordination of care, additional 10 minutes spent in record review, coordination or care, ordering tests, communicating/referring to other healthcare professionals, documenting in medical records all on the same day of the visit for total time 30 minutes spent on the visit.

## 2020-02-25 NOTE — Assessment & Plan Note (Signed)
Checking labs for any changes including clotting times. She is on aspirin 81 mg daily which can account for some bruising. Also checking metabolic causes.

## 2020-03-03 ENCOUNTER — Other Ambulatory Visit: Payer: Self-pay

## 2020-03-03 ENCOUNTER — Ambulatory Visit
Admission: RE | Admit: 2020-03-03 | Discharge: 2020-03-03 | Disposition: A | Discharge status: Home / Self Care | Payer: Medicare PPO | Source: Ambulatory Visit | Attending: Internal Medicine | Admitting: Internal Medicine

## 2020-03-03 DIAGNOSIS — Z1231 Encounter for screening mammogram for malignant neoplasm of breast: Secondary | ICD-10-CM | POA: Diagnosis not present

## 2020-03-04 ENCOUNTER — Telehealth (HOSPITAL_COMMUNITY): Payer: Self-pay

## 2020-03-04 NOTE — Telephone Encounter (Signed)
Encounter complete. 

## 2020-03-05 ENCOUNTER — Other Ambulatory Visit (HOSPITAL_COMMUNITY): Payer: Medicare PPO

## 2020-03-09 ENCOUNTER — Ambulatory Visit (HOSPITAL_COMMUNITY)
Admission: RE | Admit: 2020-03-09 | Discharge: 2020-03-09 | Disposition: A | Discharge status: Home / Self Care | Payer: Medicare PPO | Source: Ambulatory Visit | Attending: Cardiology | Admitting: Cardiology

## 2020-03-09 ENCOUNTER — Other Ambulatory Visit: Payer: Self-pay

## 2020-03-09 DIAGNOSIS — R0789 Other chest pain: Secondary | ICD-10-CM

## 2020-03-09 LAB — EXERCISE TOLERANCE TEST
Estimated workload: 7 METS
Exercise duration (min): 6 min
Exercise duration (sec): 0 s
MPHR: 152 {beats}/min
Peak HR: 155 {beats}/min
Percent HR: 101 %
Rest HR: 54 {beats}/min

## 2020-04-26 DIAGNOSIS — L720 Epidermal cyst: Secondary | ICD-10-CM | POA: Diagnosis not present

## 2020-07-11 ENCOUNTER — Other Ambulatory Visit: Payer: Self-pay | Admitting: Internal Medicine

## 2020-07-11 DIAGNOSIS — E7800 Pure hypercholesterolemia, unspecified: Secondary | ICD-10-CM

## 2020-07-11 DIAGNOSIS — I10 Essential (primary) hypertension: Secondary | ICD-10-CM

## 2020-07-21 ENCOUNTER — Other Ambulatory Visit: Payer: Self-pay

## 2020-07-21 ENCOUNTER — Encounter: Payer: Self-pay | Admitting: Family

## 2020-07-21 ENCOUNTER — Ambulatory Visit (INDEPENDENT_AMBULATORY_CARE_PROVIDER_SITE_OTHER)
Admission: RE | Admit: 2020-07-21 | Discharge: 2020-07-21 | Disposition: A | Discharge status: Home / Self Care | Payer: Medicare PPO | Source: Ambulatory Visit | Attending: Family | Admitting: Family

## 2020-07-21 ENCOUNTER — Ambulatory Visit (INDEPENDENT_AMBULATORY_CARE_PROVIDER_SITE_OTHER): Payer: Medicare PPO | Admitting: Family

## 2020-07-21 VITALS — BP 128/80 | HR 57 | Temp 98.2°F | Ht 63.0 in | Wt 174.4 lb

## 2020-07-21 DIAGNOSIS — M545 Low back pain, unspecified: Secondary | ICD-10-CM

## 2020-07-21 DIAGNOSIS — M25552 Pain in left hip: Secondary | ICD-10-CM

## 2020-07-21 DIAGNOSIS — M79672 Pain in left foot: Secondary | ICD-10-CM

## 2020-07-21 DIAGNOSIS — Z23 Encounter for immunization: Secondary | ICD-10-CM

## 2020-07-21 DIAGNOSIS — M7732 Calcaneal spur, left foot: Secondary | ICD-10-CM | POA: Diagnosis not present

## 2020-07-21 DIAGNOSIS — M19072 Primary osteoarthritis, left ankle and foot: Secondary | ICD-10-CM | POA: Diagnosis not present

## 2020-07-21 DIAGNOSIS — Z9889 Other specified postprocedural states: Secondary | ICD-10-CM | POA: Diagnosis not present

## 2020-07-21 NOTE — Progress Notes (Signed)
Amy Haynes is a 69 y.o. female with the following history as recorded in [employer]:  Patient Active Problem List   Diagnosis Date Noted  . Easy bruising 02/25/2020  . Chest pressure 02/25/2020  . Osteoarthritis 03/10/2016  . Routine general medical examination at a health care facility 03/22/2015  . HYPERCHOLESTEROLEMIA 08/26/2007  . Essential hypertension 08/26/2007    Current Outpatient Medications  Medication Sig Dispense Refill  . amLODipine-benazepril (LOTREL) 10-20 MG capsule TAKE 1 CAPSULE BY MOUTH EVERY DAY. ANNUAL APPT. DUE IN OCTOBER 90 capsule 1  . aspirin 81 MG tablet Take 81 mg by mouth daily.    . cholecalciferol (VITAMIN D) 1000 UNITS tablet Take 1,000 Units by mouth daily.    . Coenzyme Q10 (COQ10) 100 MG CAPS     . Fish Oil-Cholecalciferol (OMEGA-3 + VITAMIN D3) 1110-300 MG-UNIT CAPS Take by mouth.    . Multiple Vitamins-Minerals (MULTIVITAMIN & MINERAL PO) Take 1 tablet by mouth daily.    Marland Kitchen OVER THE COUNTER MEDICATION Multicolagen protein 1 scoop 1-2 x a week    . simvastatin (ZOCOR) 20 MG tablet TAKE 1 TABLET BY MOUTH EVERY EVENING 90 tablet 1  . SYSTANE ULTRA 0.4-0.3 % SOLN SMARTSIG:1 Drop(s) In Eye(s) As Needed    . triamterene-hydrochlorothiazide (MAXZIDE-25) 37.5-25 MG tablet TAKE 1 TABLET BY MOUTH DAILY 90 tablet 3  . Black Elderberry 50 MG/5ML SYRP Take by mouth. 1 tsp daily     No current facility-administered medications for this visit.    Allergies: Tramadol  Past Medical History:  Diagnosis Date  . Anxiety   . Arthritis    knees  . Diverticulosis of colon   . History of bronchitis   . Hypercholesteremia   . Hypertension   . Microcytosis   . Osteoporosis   . Venous insufficiency     Past Surgical History:  Procedure Laterality Date  . CESAREAN SECTION     x2  . COLONOSCOPY    . DENTAL SURGERY    . FOOT SURGERY  1991  . POLYPECTOMY      Family History  Problem Relation Age of Onset  . Arthritis Mother   . Hypertension Mother   .  Cancer Mother 24       breast  . Breast cancer Mother   . Kidney disease Sister   . Diabetes Sister   . Heart disease Sister   . Colon cancer Brother   . Colon polyps Neg Hx   . Esophageal cancer Neg Hx   . Rectal cancer Neg Hx   . Stomach cancer Neg Hx     Social History   Tobacco Use  . Smoking status: Never Smoker  . Smokeless tobacco: Never Used  Substance Use Topics  . Alcohol use: Yes    Comment: social use    Subjective:  Left hip/ back pain x 2 weeks; notes that pain "comes and goes"; denies any sensation of feeling numb/ tingling; Has also had some increased left foot pain; no specific injury;  Prefers not to take medication at this time;  Would like flu shot;    Objective:  Vitals:   07/21/20 1117  BP: 128/80  Pulse: (!) 57  Temp: 98.2 F (36.8 C)  TempSrc: Oral  SpO2: 97%  Weight: 174 lb 6.4 oz (79.1 kg)  Height: 5\' 3"  (1.6 m)    General: Well developed, well nourished, in no acute distress  Skin : Warm and dry.  Head: Normocephalic and atraumatic  Eyes: Sclera and conjunctiva  clear; pupils round and reactive to light; extraocular movements intact  Ears: External normal; canals clear; tympanic membranes normal  Oropharynx: Pink, supple. No suspicious lesions  Neck: Supple without thyromegaly, adenopathy  Lungs: Respirations unlabored;  Musculoskeletal: No deformities; no active joint inflammation  Extremities: No edema, cyanosis, clubbing  Vessels: Symmetric bilaterally  Neurologic: Alert and oriented; speech intact; face symmetrical; moves all extremities well; CNII-XII intact without focal deficit   Assessment:  1. Left hip pain   2. Left-sided low back pain without sciatica, unspecified chronicity   3. Left foot pain   4. Flu vaccine need     Plan:  X-rays updated; patient defers medication; to consider follow up with sports medicine and/or PT; Flu shot given;   Plan for CPE with her PCP in 08/2020;  This visit occurred during the  SARS-CoV-2 public health emergency.  Safety protocols were in place, including screening questions prior to the visit, additional usage of staff PPE, and extensive cleaning of exam room while observing appropriate contact time as indicated for disinfecting solutions.      No follow-ups on file.  Orders Placed This Encounter  Procedures  . DG Foot Complete Left    Standing Status:   Future    Number of Occurrences:   1    Standing Expiration Date:   07/21/2021    Order Specific Question:   Reason for Exam (SYMPTOM  OR DIAGNOSIS REQUIRED)    Answer:   left foot pain    Order Specific Question:   Preferred imaging location?    Answer:   [facility]  . DG Lumbar Spine Complete    Standing Status:   Future    Number of Occurrences:   1    Standing Expiration Date:   07/21/2021    Order Specific Question:   Reason for Exam (SYMPTOM  OR DIAGNOSIS REQUIRED)    Answer:   low back pain    Order Specific Question:   Preferred imaging location?    Answer:   [facility]  . DG Hip Unilat W OR W/O Pelvis 2-3 Views Left    Standing Status:   Future    Number of Occurrences:   1    Standing Expiration Date:   07/21/2021    Order Specific Question:   Reason for Exam (SYMPTOM  OR DIAGNOSIS REQUIRED)    Answer:   left hip pain    Order Specific Question:   Preferred imaging location?    Answer:   [facility]  . Flu Vaccine QUAD High Dose(Fluad)    Requested Prescriptions    No prescriptions requested or ordered in this encounter

## 2020-07-22 DIAGNOSIS — H0288A Meibomian gland dysfunction right eye, upper and lower eyelids: Secondary | ICD-10-CM | POA: Diagnosis not present

## 2020-07-22 DIAGNOSIS — H35411 Lattice degeneration of retina, right eye: Secondary | ICD-10-CM | POA: Diagnosis not present

## 2020-07-22 DIAGNOSIS — H0288B Meibomian gland dysfunction left eye, upper and lower eyelids: Secondary | ICD-10-CM | POA: Diagnosis not present

## 2020-07-22 DIAGNOSIS — H25813 Combined forms of age-related cataract, bilateral: Secondary | ICD-10-CM | POA: Diagnosis not present

## 2020-07-22 DIAGNOSIS — H43813 Vitreous degeneration, bilateral: Secondary | ICD-10-CM | POA: Diagnosis not present

## 2020-07-22 DIAGNOSIS — H04123 Dry eye syndrome of bilateral lacrimal glands: Secondary | ICD-10-CM | POA: Diagnosis not present

## 2020-07-22 DIAGNOSIS — H35372 Puckering of macula, left eye: Secondary | ICD-10-CM | POA: Diagnosis not present

## 2020-07-22 DIAGNOSIS — H11441 Conjunctival cysts, right eye: Secondary | ICD-10-CM | POA: Diagnosis not present

## 2020-08-08 ENCOUNTER — Encounter: Payer: Self-pay | Admitting: Family

## 2020-08-09 ENCOUNTER — Other Ambulatory Visit: Payer: Self-pay | Admitting: Family

## 2020-08-09 DIAGNOSIS — M5440 Lumbago with sciatica, unspecified side: Secondary | ICD-10-CM

## 2020-08-09 DIAGNOSIS — M7750 Other enthesopathy of unspecified foot: Secondary | ICD-10-CM

## 2020-08-16 DIAGNOSIS — M4316 Spondylolisthesis, lumbar region: Secondary | ICD-10-CM | POA: Diagnosis not present

## 2020-08-16 DIAGNOSIS — M545 Low back pain, unspecified: Secondary | ICD-10-CM | POA: Diagnosis not present

## 2020-08-16 DIAGNOSIS — M4696 Unspecified inflammatory spondylopathy, lumbar region: Secondary | ICD-10-CM | POA: Diagnosis not present

## 2020-09-02 ENCOUNTER — Ambulatory Visit (INDEPENDENT_AMBULATORY_CARE_PROVIDER_SITE_OTHER): Payer: Medicare PPO | Admitting: Internal Medicine

## 2020-09-02 ENCOUNTER — Other Ambulatory Visit: Payer: Self-pay

## 2020-09-02 ENCOUNTER — Encounter: Payer: Self-pay | Admitting: Internal Medicine

## 2020-09-02 VITALS — BP 128/76 | HR 55 | Temp 97.8°F | Ht 63.0 in | Wt 172.0 lb

## 2020-09-02 DIAGNOSIS — E7800 Pure hypercholesterolemia, unspecified: Secondary | ICD-10-CM

## 2020-09-02 DIAGNOSIS — E2839 Other primary ovarian failure: Secondary | ICD-10-CM

## 2020-09-02 DIAGNOSIS — I10 Essential (primary) hypertension: Secondary | ICD-10-CM | POA: Diagnosis not present

## 2020-09-02 DIAGNOSIS — Z0000 Encounter for general adult medical examination without abnormal findings: Secondary | ICD-10-CM

## 2020-09-02 LAB — COMPREHENSIVE METABOLIC PANEL
ALT: 26 U/L (ref 0–35)
AST: 27 U/L (ref 0–37)
Albumin: 4.4 g/dL (ref 3.5–5.2)
Alkaline Phosphatase: 60 U/L (ref 39–117)
BUN: 16 mg/dL (ref 6–23)
CO2: 31 mEq/L (ref 19–32)
Calcium: 9.5 mg/dL (ref 8.4–10.5)
Chloride: 100 mEq/L (ref 96–112)
Creatinine, Ser: 0.76 mg/dL (ref 0.40–1.20)
GFR: 80.42 mL/min (ref >60.00)
Glucose, Bld: 81 mg/dL (ref 70–99)
Potassium: 3.4 mEq/L — ABNORMAL LOW (ref 3.5–5.1)
Sodium: 137 mEq/L (ref 135–145)
Total Bilirubin: 0.6 mg/dL (ref 0.2–1.2)
Total Protein: 8.5 g/dL — ABNORMAL HIGH (ref 6.0–8.3)

## 2020-09-02 LAB — CBC
HCT: 41.6 % (ref 36.0–46.0)
Hemoglobin: 13.5 g/dL (ref 12.0–15.0)
MCHC: 32.4 g/dL (ref 30.0–36.0)
MCV: 81.5 fl (ref 78.0–100.0)
Platelets: 181 10*3/uL (ref 150.0–400.0)
RBC: 5.1 Mil/uL (ref 3.87–5.11)
RDW: 14.7 % (ref 11.5–15.5)
WBC: 3.9 10*3/uL — ABNORMAL LOW (ref 4.0–10.5)

## 2020-09-02 LAB — LIPID PANEL
Cholesterol: 168 mg/dL (ref 0–200)
HDL: 68.1 mg/dL (ref >39.00)
LDL Cholesterol: 87 mg/dL (ref 0–99)
NonHDL: 100.29
Total CHOL/HDL Ratio: 2
Triglycerides: 67 mg/dL (ref 0.0–149.0)
VLDL: 13.4 mg/dL (ref 0.0–40.0)

## 2020-09-02 LAB — HEMOGLOBIN A1C: Hgb A1c MFr Bld: 5.8 % (ref 4.6–6.5)

## 2020-09-02 MED ORDER — TRIAMCINOLONE ACETONIDE 0.1 % EX CREA: 1.0000 "application " | TOPICAL_CREAM | Freq: Two times a day (BID) | CUTANEOUS | 3 refills | Status: DC

## 2020-09-02 NOTE — Progress Notes (Signed)
Subjective:   Patient ID: Amy Haynes, female    DOB: 12-05-1950, 69 y.o.   MRN: [id number]  HPI Here for medicare wellness and physical, no new complaints. Please see A/P for status and treatment of chronic medical problems.   Diet: heart healthy Physical activity: sedentary Depression/mood screen: negative Hearing: intact to whispered voice Visual acuity: grossly normal, performs annual eye exam  ADLs: capable Fall risk: none Home safety: good Cognitive evaluation: intact to orientation, naming, recall and repetition EOL planning: adv directives discussed  [place] Visit from 07/21/2020 in [place] at [facility] Total Score 0      I have personally reviewed and have noted 1. The patient's medical and social history - reviewed today no changes 2. Their use of alcohol, tobacco or illicit drugs 3. Their current medications and supplements 4. The patient's functional ability including ADL's, fall risks, home safety risks and hearing or visual impairment. 5. Diet and physical activities 6. Evidence for depression or mood disorders 7. Care team reviewed and updated  Patient Care Team: Hoyt Koch, MD as PCP - General (Internal Medicine) Past Medical History:  Diagnosis Date  . Anxiety   . Arthritis    knees  . Diverticulosis of colon   . History of bronchitis   . Hypercholesteremia   . Hypertension   . Microcytosis   . Osteoporosis   . Venous insufficiency    Past Surgical History:  Procedure Laterality Date  . CESAREAN SECTION     x2  . COLONOSCOPY    . DENTAL SURGERY    . FOOT SURGERY  1991  . POLYPECTOMY     Family History  Problem Relation Age of Onset  . Arthritis Mother   . Hypertension Mother   . Cancer Mother 39       breast  . Breast cancer Mother   . Kidney disease Sister   . Diabetes Sister   . Heart disease Sister   . Colon cancer Brother   . Colon polyps Neg Hx   . Esophageal cancer Neg Hx   .  Rectal cancer Neg Hx   . Stomach cancer Neg Hx     Review of Systems  Constitutional: Negative.   HENT: Negative.   Eyes: Negative.   Respiratory: Negative for cough, chest tightness and shortness of breath.   Cardiovascular: Negative for chest pain, palpitations and leg swelling.  Gastrointestinal: Negative for abdominal distention, abdominal pain, constipation, diarrhea, nausea and vomiting.  Musculoskeletal: Positive for arthralgias.  Skin: Negative.   Neurological: Negative.   Psychiatric/Behavioral: Negative.     Objective:  Physical Exam Constitutional:      Appearance: She is well-developed and well-nourished.  HENT:     Head: Normocephalic and atraumatic.  Eyes:     Extraocular Movements: EOM normal.  Cardiovascular:     Rate and Rhythm: Normal rate and regular rhythm.  Pulmonary:     Effort: Pulmonary effort is normal. No respiratory distress.     Breath sounds: Normal breath sounds. No wheezing or rales.  Abdominal:     General: Bowel sounds are normal. There is no distension.     Palpations: Abdomen is soft.     Tenderness: There is no abdominal tenderness. There is no rebound.  Musculoskeletal:        General: No edema.     Cervical back: Normal range of motion.  Skin:    General: Skin is warm and dry.  Neurological:  Mental Status: She is alert and oriented to person, place, and time.     Coordination: Coordination normal.  Psychiatric:        Mood and Affect: Mood and affect normal.     Vitals:   09/02/20 1347  BP: 128/76  Pulse: (!) 55  Temp: 97.8 F (36.6 C)  TempSrc: Oral  SpO2: 97%  Weight: 172 lb (78 kg)  Height: 5\' 3"  (1.6 m)   This visit occurred during the SARS-CoV-2 public health emergency.  Safety protocols were in place, including screening questions prior to the visit, additional usage of staff PPE, and extensive cleaning of exam room while observing appropriate contact time as indicated for disinfecting solutions.   Assessment  & Plan:

## 2020-09-02 NOTE — Patient Instructions (Signed)
Health Maintenance, Female Adopting a healthy lifestyle and getting preventive care are important in promoting health and wellness. Ask your health care provider about:  The right schedule for you to have regular tests and exams.  Things you can do on your own to prevent diseases and keep yourself healthy. What should I know about diet, weight, and exercise? Eat a healthy diet   Eat a diet that includes plenty of vegetables, fruits, low-fat dairy products, and lean protein.  Do not eat a lot of foods that are high in solid fats, added sugars, or sodium. Maintain a healthy weight Body mass index (BMI) is used to identify weight problems. It estimates body fat based on height and weight. Your health care provider can help determine your BMI and help you achieve or maintain a healthy weight. Get regular exercise Get regular exercise. This is one of the most important things you can do for your health. Most adults should:  Exercise for at least 150 minutes each week. The exercise should increase your heart rate and make you sweat (moderate-intensity exercise).  Do strengthening exercises at least twice a week. This is in addition to the moderate-intensity exercise.  Spend less time sitting. Even light physical activity can be beneficial. Watch cholesterol and blood lipids Have your blood tested for lipids and cholesterol at 68 years of age, then have this test every 5 years. Have your cholesterol levels checked more often if:  Your lipid or cholesterol levels are high.  You are older than 68 years of age.  You are at high risk for heart disease. What should I know about cancer screening? Depending on your health history and family history, you may need to have cancer screening at various ages. This may include screening for:  Breast cancer.  Cervical cancer.  Colorectal cancer.  Skin cancer.  Lung cancer. What should I know about heart disease, diabetes, and high blood  pressure? Blood pressure and heart disease  High blood pressure causes heart disease and increases the risk of stroke. This is more likely to develop in people who have high blood pressure readings, are of African descent, or are overweight.  Have your blood pressure checked: ? Every 3-5 years if you are 18-39 years of age. ? Every year if you are 40 years old or older. Diabetes Have regular diabetes screenings. This checks your fasting blood sugar level. Have the screening done:  Once every three years after age 40 if you are at a normal weight and have a low risk for diabetes.  More often and at a younger age if you are overweight or have a high risk for diabetes. What should I know about preventing infection? Hepatitis B If you have a higher risk for hepatitis B, you should be screened for this virus. Talk with your health care provider to find out if you are at risk for hepatitis B infection. Hepatitis C Testing is recommended for:  Everyone born from 1945 through 1965.  Anyone with known risk factors for hepatitis C. Sexually transmitted infections (STIs)  Get screened for STIs, including gonorrhea and chlamydia, if: ? You are sexually active and are younger than 68 years of age. ? You are older than 68 years of age and your health care provider tells you that you are at risk for this type of infection. ? Your sexual activity has changed since you were last screened, and you are at increased risk for chlamydia or gonorrhea. Ask your health care provider if   you are at risk.  Ask your health care provider about whether you are at high risk for HIV. Your health care provider may recommend a prescription medicine to help prevent HIV infection. If you choose to take medicine to prevent HIV, you should first get tested for HIV. You should then be tested every 3 months for as long as you are taking the medicine. Pregnancy  If you are about to stop having your period (premenopausal) and  you may become pregnant, seek counseling before you get pregnant.  Take 400 to 800 micrograms (mcg) of folic acid every day if you become pregnant.  Ask for birth control (contraception) if you want to prevent pregnancy. Osteoporosis and menopause Osteoporosis is a disease in which the bones lose minerals and strength with aging. This can result in bone fractures. If you are 65 years old or older, or if you are at risk for osteoporosis and fractures, ask your health care provider if you should:  Be screened for bone loss.  Take a calcium or vitamin D supplement to lower your risk of fractures.  Be given hormone replacement therapy (HRT) to treat symptoms of menopause. Follow these instructions at home: Lifestyle  Do not use any products that contain nicotine or tobacco, such as cigarettes, e-cigarettes, and chewing tobacco. If you need help quitting, ask your health care provider.  Do not use street drugs.  Do not share needles.  Ask your health care provider for help if you need support or information about quitting drugs. Alcohol use  Do not drink alcohol if: ? Your health care provider tells you not to drink. ? You are pregnant, may be pregnant, or are planning to become pregnant.  If you drink alcohol: ? Limit how much you use to 0-1 drink a day. ? Limit intake if you are breastfeeding.  Be aware of how much alcohol is in your drink. In the [place] one drink equals one 12 oz bottle of beer (355 mL), one 5 oz glass of wine (148 mL), or one 1 oz glass of hard liquor (44 mL). General instructions  Schedule regular health, dental, and eye exams.  Stay current with your vaccines.  Tell your health care provider if: ? You often feel depressed. ? You have ever been abused or do not feel safe at home. Summary  Adopting a healthy lifestyle and getting preventive care are important in promoting health and wellness.  Follow your health care provider's instructions about healthy  diet, exercising, and getting tested or screened for diseases.  Follow your health care provider's instructions on monitoring your cholesterol and blood pressure. This information is not intended to replace advice given to you by your health care provider. Make sure you discuss any questions you have with your health care provider. Document Revised: 09/04/2018 Document Reviewed: 09/04/2018 Elsevier Patient Education  2020 [employer] Inc[employer]  

## 2020-09-03 DIAGNOSIS — M545 Low back pain, unspecified: Secondary | ICD-10-CM | POA: Diagnosis not present

## 2020-09-03 NOTE — Assessment & Plan Note (Signed)
Flu shot up to date. Covid-19 up to date including booster. Pneumonia complete. Shingrix counseled. Tetanus due 2025. Colonoscopy due 2025. Mammogram due 2023, pap smear aged out and dexa ordered. Counseled about sun safety and mole surveillance. Counseled about the dangers of distracted driving. Given 10 year screening recommendations.

## 2020-09-03 NOTE — Assessment & Plan Note (Signed)
BP at goal on amlodipine/benazepril. Checking CMP and adjust as needed.

## 2020-09-03 NOTE — Assessment & Plan Note (Signed)
Checking lipid panel and adjust simvastatin 20 mg daily as needed.  

## 2020-09-08 DIAGNOSIS — M19072 Primary osteoarthritis, left ankle and foot: Secondary | ICD-10-CM | POA: Diagnosis not present

## 2020-09-08 DIAGNOSIS — M79672 Pain in left foot: Secondary | ICD-10-CM | POA: Diagnosis not present

## 2020-09-08 DIAGNOSIS — M722 Plantar fascial fibromatosis: Secondary | ICD-10-CM | POA: Diagnosis not present

## 2020-09-10 DIAGNOSIS — M545 Low back pain, unspecified: Secondary | ICD-10-CM | POA: Diagnosis not present

## 2020-09-25 HISTORY — PX: CYST EXCISION: SHX5701

## 2020-10-05 ENCOUNTER — Encounter: Payer: Self-pay | Admitting: Internal Medicine

## 2020-10-09 ENCOUNTER — Other Ambulatory Visit: Payer: Self-pay | Admitting: Internal Medicine

## 2020-10-09 DIAGNOSIS — I10 Essential (primary) hypertension: Secondary | ICD-10-CM

## 2020-10-18 ENCOUNTER — Other Ambulatory Visit: Payer: Self-pay

## 2020-10-18 ENCOUNTER — Other Ambulatory Visit: Payer: Medicare PPO

## 2020-10-18 DIAGNOSIS — Z20822 Contact with and (suspected) exposure to covid-19: Secondary | ICD-10-CM | POA: Diagnosis not present

## 2020-10-19 LAB — SARS-COV-2, NAA 2 DAY TAT

## 2020-10-19 LAB — NOVEL CORONAVIRUS, NAA: SARS-CoV-2, NAA: DETECTED — AB

## 2020-10-28 ENCOUNTER — Other Ambulatory Visit: Payer: Medicare PPO

## 2020-10-28 DIAGNOSIS — Z3009 Encounter for other general counseling and advice on contraception: Secondary | ICD-10-CM

## 2020-10-28 DIAGNOSIS — Z119 Encounter for screening for infectious and parasitic diseases, unspecified: Secondary | ICD-10-CM | POA: Diagnosis not present

## 2020-10-31 LAB — NOVEL CORONAVIRUS, NAA

## 2020-11-17 ENCOUNTER — Ambulatory Visit (INDEPENDENT_AMBULATORY_CARE_PROVIDER_SITE_OTHER)
Admission: RE | Admit: 2020-11-17 | Discharge: 2020-11-17 | Disposition: A | Discharge status: Home / Self Care | Payer: Medicare PPO | Source: Ambulatory Visit | Attending: Internal Medicine | Admitting: Internal Medicine

## 2020-11-17 ENCOUNTER — Other Ambulatory Visit: Payer: Self-pay

## 2020-11-17 DIAGNOSIS — E2839 Other primary ovarian failure: Secondary | ICD-10-CM

## 2020-11-19 ENCOUNTER — Other Ambulatory Visit: Payer: Self-pay | Admitting: Internal Medicine

## 2020-11-19 MED ORDER — ALENDRONATE SODIUM 70 MG PO TABS: 70.0000 mg | ORAL_TABLET | ORAL | 3 refills | Status: DC

## 2020-11-20 ENCOUNTER — Encounter: Payer: Self-pay | Admitting: Internal Medicine

## 2020-11-22 NOTE — Telephone Encounter (Signed)
See below

## 2020-11-30 DIAGNOSIS — M545 Low back pain, unspecified: Secondary | ICD-10-CM | POA: Diagnosis not present

## 2020-11-30 DIAGNOSIS — M62838 Other muscle spasm: Secondary | ICD-10-CM | POA: Diagnosis not present

## 2020-11-30 DIAGNOSIS — M542 Cervicalgia: Secondary | ICD-10-CM | POA: Diagnosis not present

## 2020-11-30 DIAGNOSIS — M9903 Segmental and somatic dysfunction of lumbar region: Secondary | ICD-10-CM | POA: Diagnosis not present

## 2020-11-30 DIAGNOSIS — M9901 Segmental and somatic dysfunction of cervical region: Secondary | ICD-10-CM | POA: Diagnosis not present

## 2020-11-30 DIAGNOSIS — M546 Pain in thoracic spine: Secondary | ICD-10-CM | POA: Diagnosis not present

## 2020-11-30 DIAGNOSIS — M9902 Segmental and somatic dysfunction of thoracic region: Secondary | ICD-10-CM | POA: Diagnosis not present

## 2020-11-30 DIAGNOSIS — M62830 Muscle spasm of back: Secondary | ICD-10-CM | POA: Diagnosis not present

## 2020-12-02 ENCOUNTER — Other Ambulatory Visit: Payer: Self-pay

## 2020-12-02 ENCOUNTER — Ambulatory Visit (INDEPENDENT_AMBULATORY_CARE_PROVIDER_SITE_OTHER): Payer: Medicare PPO | Admitting: Internal Medicine

## 2020-12-02 ENCOUNTER — Encounter: Payer: Self-pay | Admitting: Internal Medicine

## 2020-12-02 DIAGNOSIS — M810 Age-related osteoporosis without current pathological fracture: Secondary | ICD-10-CM | POA: Insufficient documentation

## 2020-12-02 NOTE — Patient Instructions (Addendum)
We will repeat the bone density in about 2 years.  Work on doing exercise 3-5 days a week for 30 minutes.   Making sure to get calcium 500 mg twice a day (supplement or diet) and vitamin D 2000 units daily.

## 2020-12-02 NOTE — Progress Notes (Unsigned)
   Subjective:   Patient ID: Amy Haynes, female    DOB: 06-26-1951, 70 y.o.   MRN: [id number]  HPI The patient is a 70 YO female needing more information about recent diagnosis osteoporosis and potential options for treatment. Previously on prednisone long term for sarcoid but many years ago. Not currently on and no current fracture. Parents both without hip fracture and non-smoker.   Review of Systems  Constitutional: Negative.   HENT: Negative.   Eyes: Negative.   Respiratory: Negative for cough, chest tightness and shortness of breath.   Cardiovascular: Negative for chest pain, palpitations and leg swelling.  Gastrointestinal: Negative for abdominal distention, abdominal pain, constipation, diarrhea, nausea and vomiting.  Musculoskeletal: Negative.   Skin: Negative.   Neurological: Negative.   Psychiatric/Behavioral: Negative.     Objective:  Physical Exam Constitutional:      Appearance: She is well-developed.  HENT:     Head: Normocephalic and atraumatic.  Cardiovascular:     Rate and Rhythm: Normal rate and regular rhythm.  Pulmonary:     Effort: Pulmonary effort is normal. No respiratory distress.     Breath sounds: Normal breath sounds. No wheezing or rales.  Abdominal:     General: Bowel sounds are normal. There is no distension.     Palpations: Abdomen is soft.     Tenderness: There is no abdominal tenderness. There is no rebound.  Musculoskeletal:     Cervical back: Normal range of motion.  Skin:    General: Skin is warm and dry.  Neurological:     Mental Status: She is alert and oriented to person, place, and time.     Coordination: Coordination normal.     Vitals:   12/02/20 1249  BP: 126/70  Pulse: 61  Resp: 18  Temp: 98.1 F (36.7 C)  TempSrc: Oral  SpO2: 97%  Weight: 173 lb 6.4 oz (78.7 kg)  Height: 5\' 3"  (1.6 m)    This visit occurred during the SARS-CoV-2 public health emergency.  Safety protocols were in place, including screening  questions prior to the visit, additional usage of staff PPE, and extensive cleaning of exam room while observing appropriate contact time as indicated for disinfecting solutions.   Assessment & Plan:  Visit time 25 minutes in face to face communication with patient and coordination of care, additional 5 minutes spent in record review, coordination or care, ordering tests, communicating/referring to other healthcare professionals, documenting in medical records all on the same day of the visit for total time 30 minutes spent on the visit.

## 2020-12-03 NOTE — Assessment & Plan Note (Signed)
Counseling done today about etiology and options for treatment versus monitoring. Her frax major fracture risk 11% and hip 1.7% which are both under treatment guidelines. We have elected through shared decision making to wait 2 years, repeat DEXA. Counseled about calcium and vitamin D and weight bearing exercise.

## 2021-01-18 ENCOUNTER — Other Ambulatory Visit: Payer: Self-pay

## 2021-01-18 ENCOUNTER — Encounter: Payer: Self-pay | Admitting: Internal Medicine

## 2021-01-18 DIAGNOSIS — I10 Essential (primary) hypertension: Secondary | ICD-10-CM

## 2021-01-18 DIAGNOSIS — E7800 Pure hypercholesterolemia, unspecified: Secondary | ICD-10-CM

## 2021-01-18 MED ORDER — TRIAMTERENE-HCTZ 37.5-25 MG PO TABS: 1.0000 | ORAL_TABLET | Freq: Every day | ORAL | 3 refills | Status: DC

## 2021-01-18 MED ORDER — AMLODIPINE BESY-BENAZEPRIL HCL 10-20 MG PO CAPS: ORAL_CAPSULE | ORAL | 1 refills | Status: DC

## 2021-01-18 MED ORDER — SIMVASTATIN 20 MG PO TABS: 20.0000 mg | ORAL_TABLET | Freq: Every evening | ORAL | 1 refills | Status: DC

## 2021-02-01 ENCOUNTER — Other Ambulatory Visit: Payer: Self-pay | Admitting: Internal Medicine

## 2021-02-01 DIAGNOSIS — Z1231 Encounter for screening mammogram for malignant neoplasm of breast: Secondary | ICD-10-CM

## 2021-03-25 ENCOUNTER — Ambulatory Visit
Admission: RE | Admit: 2021-03-25 | Discharge: 2021-03-25 | Disposition: A | Discharge status: Home / Self Care | Payer: Medicare PPO | Source: Ambulatory Visit

## 2021-03-25 ENCOUNTER — Other Ambulatory Visit: Payer: Self-pay

## 2021-03-25 DIAGNOSIS — Z1231 Encounter for screening mammogram for malignant neoplasm of breast: Secondary | ICD-10-CM

## 2021-06-24 DIAGNOSIS — N950 Postmenopausal bleeding: Secondary | ICD-10-CM | POA: Diagnosis not present

## 2021-07-06 ENCOUNTER — Other Ambulatory Visit: Payer: Self-pay | Admitting: Internal Medicine

## 2021-07-06 DIAGNOSIS — I10 Essential (primary) hypertension: Secondary | ICD-10-CM

## 2021-07-06 DIAGNOSIS — E7800 Pure hypercholesterolemia, unspecified: Secondary | ICD-10-CM

## 2021-07-13 DIAGNOSIS — N719 Inflammatory disease of uterus, unspecified: Secondary | ICD-10-CM | POA: Diagnosis not present

## 2021-07-13 DIAGNOSIS — N950 Postmenopausal bleeding: Secondary | ICD-10-CM | POA: Diagnosis not present

## 2021-10-21 ENCOUNTER — Ambulatory Visit (INDEPENDENT_AMBULATORY_CARE_PROVIDER_SITE_OTHER): Payer: Medicare PPO

## 2021-10-21 ENCOUNTER — Other Ambulatory Visit: Payer: Self-pay

## 2021-10-21 DIAGNOSIS — Z0000 Encounter for general adult medical examination without abnormal findings: Secondary | ICD-10-CM | POA: Diagnosis not present

## 2021-10-21 NOTE — Progress Notes (Signed)
Subjective:   Amy Haynes is a 71 y.o. female who presents for Medicare Annual (Subsequent) preventive examination.  I connected with Anayla Giannetti today by telephone and verified that I am speaking with the correct person using two identifiers. Location patient: home Location provider: work Persons participating in the virtual visit: patient, provider.   I discussed the limitations, risks, security and privacy concerns of performing an evaluation and management service by telephone and the availability of in person appointments. I also discussed with the patient that there may be a patient responsible charge related to this service. The patient expressed understanding and verbally consented to this telephonic visit.    Interactive audio and video telecommunications were attempted between this provider and patient, however failed, due to patient having technical difficulties OR patient did not have access to video capability.  We continued and completed visit with audio only.    Review of Systems     Cardiac Risk Factors include: advanced age (>81men, >8 women);dyslipidemia;hypertension     Objective:    Today's Vitals   There is no height or weight on file to calculate BMI.  Advanced Directives 10/21/2021 03/23/2019 03/30/2017 09/13/2016 08/30/2016  Does Patient Have a Medical Advance Directive? Yes Yes No No No  Type of Paramedic of [place];Living will - - - -  Copy of [place] in Chart? No - copy requested - - - -  Would patient like information on creating a medical advance directive? - - Yes (ED - Information included in AVS) - -    Current Medications (verified) Outpatient Encounter Medications as of 10/21/2021  Medication Sig   amLODipine-benazepril (LOTREL) 10-20 MG capsule TAKE 1 CAPSULE BY MOUTH EVERY DAY. ANNUAL APPOINTMENT DUE IN OCTOBER   aspirin 81 MG tablet Take 81 mg by mouth daily.   cholecalciferol (VITAMIN D) 1000  UNITS tablet Take 1,000 Units by mouth daily.   Coenzyme Q10 (COQ10) 100 MG CAPS    Fish Oil-Cholecalciferol (OMEGA-3 + VITAMIN D3) 1110-300 MG-UNIT CAPS Take by mouth.   Multiple Vitamins-Minerals (MULTIVITAMIN & MINERAL PO) Take 1 tablet by mouth daily.   OVER THE COUNTER MEDICATION Multicolagen protein 1 scoop 1-2 x a week   simvastatin (ZOCOR) 20 MG tablet TAKE 1 TABLET(20 MG) BY MOUTH EVERY EVENING   SYSTANE ULTRA 0.4-0.3 % SOLN SMARTSIG:1 Drop(s) In Eye(s) As Needed   triamcinolone (KENALOG) 0.1 % Apply 1 application topically 2 (two) times daily.   triamterene-hydrochlorothiazide (MAXZIDE-25) 37.5-25 MG tablet Take 1 tablet by mouth daily.   No facility-administered encounter medications on file as of 10/21/2021.    Allergies (verified) Tramadol   History: Past Medical History:  Diagnosis Date   Anxiety    Arthritis    knees   Diverticulosis of colon    History of bronchitis    Hypercholesteremia    Hypertension    Microcytosis    Osteoporosis    Venous insufficiency    Past Surgical History:  Procedure Laterality Date   CESAREAN SECTION     x2   COLONOSCOPY     DENTAL SURGERY     FOOT SURGERY  1991   POLYPECTOMY     Family History  Problem Relation Age of Onset   Arthritis Mother    Hypertension Mother    Cancer Mother 4       breast   Breast cancer Mother    Kidney disease Sister    Diabetes Sister    Heart disease Sister  Colon cancer Brother    Colon polyps Neg Hx    Esophageal cancer Neg Hx    Rectal cancer Neg Hx    Stomach cancer Neg Hx    Social History   Socioeconomic History   Marital status: Single    Spouse name: Not on file   Number of children: 2   Years of education: Not on file   Highest education level: Not on file  Occupational History   Not on file  Tobacco Use   Smoking status: Never   Smokeless tobacco: Never  Vaping Use   Vaping Use: Never used  Substance and Sexual Activity   Alcohol use: Yes    Comment: social  use   Drug use: No   Sexual activity: Not Currently  Other Topics Concern   Not on file  Social History Narrative   Not on file   Social Determinants of Health   Financial Resource Strain: Low Risk    Difficulty of Paying Living Expenses: Not hard at all  Food Insecurity: No Food Insecurity   Worried About Charity fundraiser in the Last Year: Never true   Ran Out of Food in the Last Year: Never true  Transportation Needs: No Transportation Needs   Lack of Transportation (Medical): No   Lack of Transportation (Non-Medical): No  Physical Activity: Sufficiently Active   Days of Exercise per Week: 5 days   Minutes of Exercise per Session: 50 min  Stress: No Stress Concern Present   Feeling of Stress : Not at all  Social Connections: Moderately Isolated   Frequency of Communication with Friends and Family: Three times a week   Frequency of Social Gatherings with Friends and Family: Three times a week   Attends Religious Services: More than 4 times per year   Active Member of Clubs or Organizations: No   Attends Archivist Meetings: Never   Marital Status: Divorced    Tobacco Counseling Counseling given: Not Answered   Clinical Intake:  Pre-visit preparation completed: Yes  Pain : No/denies pain     Nutritional Risks: None Diabetes: No  How often do you need to have someone help you when you read instructions, pamphlets, or other written materials from your doctor or pharmacy?: 1 - Never What is the last grade level you completed in school?: masters  Diabetic?no   Interpreter Needed?: No  Information entered by :: [place] of Daily Living In your present state of health, do you have any difficulty performing the following activities: 10/21/2021  Hearing? N  Vision? N  Difficulty concentrating or making decisions? N  Walking or climbing stairs? N  Dressing or bathing? N  Doing errands, shopping? N  Preparing Food and eating ? N   Using the Toilet? N  In the past six months, have you accidently leaked urine? N  Do you have problems with loss of bowel control? N  Managing your Medications? N  Managing your Finances? N  Housekeeping or managing your Housekeeping? N  Some recent data might be hidden    Patient Care Team: Hoyt Koch, MD as PCP - General (Internal Medicine)  Indicate any recent Medical Services you may have received from other than [facility] providers in the past year (date may be approximate).     Assessment:   This is a routine wellness examination for Josalyn.  Hearing/Vision screen Vision Screening - Comments:: Annual eye exams wears reading glasses   Dietary issues and exercise activities  discussed: Current Exercise Habits: Home exercise routine, Type of exercise: walking, Time (Minutes): 45, Frequency (Times/Week): 5, Weekly Exercise (Minutes/Week): 225, Intensity: Mild, Exercise limited by: None identified   Goals Addressed   None    Depression Screen PHQ 2/9 Scores 10/21/2021 10/21/2021 07/21/2020 07/25/2019 04/16/2018 03/30/2017 02/09/2014  PHQ - 2 Score 0 0 0 1 0 0 0    Fall Risk Fall Risk  10/21/2021 07/21/2020 07/25/2019 04/16/2018 03/30/2017  Falls in the past year? 0 0 0 No No  Number falls in past yr: 0 0 - - -  Injury with Fall? 0 0 - - -  Risk for fall due to : No Fall Risks No Fall Risks - - -  Follow up Falls evaluation completed Falls evaluation completed - - -    FALL RISK PREVENTION PERTAINING TO THE HOME:  Any stairs in or around the home? No  If so, are there any without handrails? No  Home free of loose throw rugs in walkways, pet beds, electrical cords, etc? Yes  Adequate lighting in your home to reduce risk of falls? Yes   ASSISTIVE DEVICES UTILIZED TO PREVENT FALLS:  Life alert? No  Use of a cane, walker or w/c? No  Grab bars in the bathroom? No  Shower chair or bench in shower? No  Elevated toilet seat or a handicapped toilet? No   Cognitive  Function:Normal cognitive status assessed by direct observation by this Nurse Health Advisor. No abnormalities found.   MMSE - Mini Mental State Exam 03/30/2017  Orientation to time 5  Orientation to Place 5  Registration 3  Attention/ Calculation 5  Recall 3  Language- name 2 objects 2  Language- repeat 1  Language- follow 3 step command 3  Language- read & follow direction 1  Write a sentence 1  Copy design 1  Total score 30        Immunizations Immunization History  Administered Date(s) Administered   Fluad Quad(high Dose 65+) 07/25/2019, 07/21/2020   Influenza Split 07/01/2012, 07/12/2013   Influenza, High Dose Seasonal PF 07/18/2018   Influenza,inj,Quad PF,6+ Mos 07/17/2017   Influenza-Unspecified 05/26/1988, 06/25/2014, 07/29/2016   PFIZER(Purple Top)SARS-COV-2 Vaccination 11/01/2019, 11/22/2019, 06/24/2020   PPD Test 06/09/2015   Pneumococcal Conjugate-13 07/17/2017   Pneumococcal Polysaccharide-23 07/18/2018   Tdap 02/09/2014   Zoster, Live 03/23/2016    TDAP status: Up to date  Flu Vaccine status: Up to date  Pneumococcal vaccine status: Up to date  Covid-19 vaccine status: Completed vaccines  Qualifies for Shingles Vaccine? Yes   Zostavax completed No   Shingrix Completed?: No.    Education has been provided regarding the importance of this vaccine. Patient has been advised to call insurance company to determine out of pocket expense if they have not yet received this vaccine. Advised may also receive vaccine at local pharmacy or Health Dept. Verbalized acceptance and understanding.  Screening Tests Health Maintenance  Topic Date Due   Zoster Vaccines- Shingrix (1 of 2) Never done   COVID-19 Vaccine (4 - Booster for Pfizer series) 08/19/2020   INFLUENZA VACCINE  04/25/2021   MAMMOGRAM  03/25/2022   TETANUS/TDAP  02/10/2024   COLONOSCOPY (Pts 45-42yrs Insurance coverage will need to be confirmed)  09/15/2024   Pneumonia Vaccine 58+ Years old  Completed    DEXA SCAN  Completed   Hepatitis C Screening  Completed   HPV VACCINES  Aged Out    Health Maintenance  Health Maintenance Due  Topic Date Due   Zoster Vaccines-  Shingrix (1 of 2) Never done   COVID-19 Vaccine (4 - Booster for Pfizer series) 08/19/2020   INFLUENZA VACCINE  04/25/2021    Colorectal cancer screening: Type of screening: Colonoscopy. Completed 02/09/2014. Repeat every 10 years  Mammogram status: Completed 03/25/2021. Repeat every year  Bone Density status: Completed 11/17/2020. Results reflect: Bone density results: OSTEOPENIA. Repeat every 5 years.   Lung Cancer Screening: (Low Dose CT Chest recommended if Age 30-80 years, 30 pack-year currently smoking OR have quit w/in 15years.) does not qualify.   Lung Cancer Screening Referral: n/a  Additional Screening:  Hepatitis C Screening: does not qualify; Completed 03/23/2016  Vision Screening: Recommended annual ophthalmology exams for early detection of glaucoma and other disorders of the eye. Is the patient up to date with their annual eye exam?  Yes  Who is the provider or what is the name of the office in which the patient attends annual eye exams? [facility]  If pt is not established with a provider, would they like to be referred to a provider to establish care? No .   Dental Screening: Recommended annual dental exams for proper oral hygiene  Community Resource Referral / Chronic Care Management: CRR required this visit?  No   CCM required this visit?  No      Plan:     I have personally reviewed and noted the following in the patients chart:   Medical and social history Use of alcohol, tobacco or illicit drugs  Current medications and supplements including opioid prescriptions.  Functional ability and status Nutritional status Physical activity Advanced directives List of other physicians Hospitalizations, surgeries, and ER visits in previous 12 months Vitals Screenings to include cognitive,  depression, and falls Referrals and appointments  In addition, I have reviewed and discussed with patient certain preventive protocols, quality metrics, and best practice recommendations. A written personalized care plan for preventive services as well as general preventive health recommendations were provided to patient.     Randel Pigg, LPN   03/18/7627   Nurse Notes: none

## 2021-10-21 NOTE — Patient Instructions (Signed)
Amy Haynes , Thank you for taking time to come for your Medicare Wellness Visit. I appreciate your ongoing commitment to your health goals. Please review the following plan we discussed and let me know if I can assist you in the future.   Screening recommendations/referrals: Colonoscopy: 02/09/2014  due 2025 Mammogram: 03/25/2021 Bone Density: 11/17/2020 Recommended yearly ophthalmology/optometry visit for glaucoma screening and checkup Recommended yearly dental visit for hygiene and checkup  Vaccinations: Influenza vaccine: completed  Pneumococcal vaccine: completed  Tdap vaccine: 02/09/2014 Shingles vaccine: will consider     Advanced directives: yes   Conditions/risks identified: none   Next appointment: none    Preventive Care 17 Years and Older, Female Preventive care refers to lifestyle choices and visits with your health care provider that can promote health and wellness. What does preventive care include? A yearly physical exam. This is also called an annual well check. Dental exams once or twice a year. Routine eye exams. Ask your health care provider how often you should have your eyes checked. Personal lifestyle choices, including: Daily care of your teeth and gums. Regular physical activity. Eating a healthy diet. Avoiding tobacco and drug use. Limiting alcohol use. Practicing safe sex. Taking low-dose aspirin every day. Taking vitamin and mineral supplements as recommended by your health care provider. What happens during an annual well check? The services and screenings done by your health care provider during your annual well check will depend on your age, overall health, lifestyle risk factors, and family history of disease. Counseling  Your health care provider may ask you questions about your: Alcohol use. Tobacco use. Drug use. Emotional well-being. Home and relationship well-being. Sexual activity. Eating habits. History of falls. Memory and  ability to understand (cognition). Work and work Statistician. Reproductive health. Screening  You may have the following tests or measurements: Height, weight, and BMI. Blood pressure. Lipid and cholesterol levels. These may be checked every 5 years, or more frequently if you are over 37 years old. Skin check. Lung cancer screening. You may have this screening every year starting at age 66 if you have a 30-pack-year history of smoking and currently smoke or have quit within the past 15 years. Fecal occult blood test (FOBT) of the stool. You may have this test every year starting at age 13. Flexible sigmoidoscopy or colonoscopy. You may have a sigmoidoscopy every 5 years or a colonoscopy every 10 years starting at age 47. Hepatitis C blood test. Hepatitis B blood test. Sexually transmitted disease (STD) testing. Diabetes screening. This is done by checking your blood sugar (glucose) after you have not eaten for a while (fasting). You may have this done every 1-3 years. Bone density scan. This is done to screen for osteoporosis. You may have this done starting at age 24. Mammogram. This may be done every 1-2 years. Talk to your health care provider about how often you should have regular mammograms. Talk with your health care provider about your test results, treatment options, and if necessary, the need for more tests. Vaccines  Your health care provider may recommend certain vaccines, such as: Influenza vaccine. This is recommended every year. Tetanus, diphtheria, and acellular pertussis (Tdap, Td) vaccine. You may need a Td booster every 10 years. Zoster vaccine. You may need this after age 49. Pneumococcal 13-valent conjugate (PCV13) vaccine. One dose is recommended after age 13. Pneumococcal polysaccharide (PPSV23) vaccine. One dose is recommended after age 65. Talk to your health care provider about which screenings and vaccines you need  and how often you need them. This information is  not intended to replace advice given to you by your health care provider. Make sure you discuss any questions you have with your health care provider. Document Released: 10/08/2015 Document Revised: 05/31/2016 Document Reviewed: 07/13/2015 Elsevier Interactive Patient Education  2017 [place] Prevention in the Home Falls can cause injuries. They can happen to people of all ages. There are many things you can do to make your home safe and to help prevent falls. What can I do on the outside of my home? Regularly fix the edges of walkways and driveways and fix any cracks. Remove anything that might make you trip as you walk through a door, such as a raised step or threshold. Trim any bushes or trees on the path to your home. Use bright outdoor lighting. Clear any walking paths of anything that might make someone trip, such as rocks or tools. Regularly check to see if handrails are loose or broken. Make sure that both sides of any steps have handrails. Any raised decks and porches should have guardrails on the edges. Have any leaves, snow, or ice cleared regularly. Use sand or salt on walking paths during winter. Clean up any spills in your garage right away. This includes oil or grease spills. What can I do in the bathroom? Use night lights. Install grab bars by the toilet and in the tub and shower. Do not use towel bars as grab bars. Use non-skid mats or decals in the tub or shower. If you need to sit down in the shower, use a plastic, non-slip stool. Keep the floor dry. Clean up any water that spills on the floor as soon as it happens. Remove soap buildup in the tub or shower regularly. Attach bath mats securely with double-sided non-slip rug tape. Do not have throw rugs and other things on the floor that can make you trip. What can I do in the bedroom? Use night lights. Make sure that you have a light by your bed that is easy to reach. Do not use any sheets or blankets that  are too big for your bed. They should not hang down onto the floor. Have a firm chair that has side arms. You can use this for support while you get dressed. Do not have throw rugs and other things on the floor that can make you trip. What can I do in the kitchen? Clean up any spills right away. Avoid walking on wet floors. Keep items that you use a lot in easy-to-reach places. If you need to reach something above you, use a strong step stool that has a grab bar. Keep electrical cords out of the way. Do not use floor polish or wax that makes floors slippery. If you must use wax, use non-skid floor wax. Do not have throw rugs and other things on the floor that can make you trip. What can I do with my stairs? Do not leave any items on the stairs. Make sure that there are handrails on both sides of the stairs and use them. Fix handrails that are broken or loose. Make sure that handrails are as long as the stairways. Check any carpeting to make sure that it is firmly attached to the stairs. Fix any carpet that is loose or worn. Avoid having throw rugs at the top or bottom of the stairs. If you do have throw rugs, attach them to the floor with carpet tape. Make sure that you  have a light switch at the top of the stairs and the bottom of the stairs. If you do not have them, ask someone to add them for you. What else can I do to help prevent falls? Wear shoes that: Do not have high heels. Have rubber bottoms. Are comfortable and fit you well. Are closed at the toe. Do not wear sandals. If you use a stepladder: Make sure that it is fully opened. Do not climb a closed stepladder. Make sure that both sides of the stepladder are locked into place. Ask someone to hold it for you, if possible. Clearly mark and make sure that you can see: Any grab bars or handrails. First and last steps. Where the edge of each step is. Use tools that help you move around (mobility aids) if they are needed. These  include: Canes. Walkers. Scooters. Crutches. Turn on the lights when you go into a dark area. Replace any light bulbs as soon as they burn out. Set up your furniture so you have a clear path. Avoid moving your furniture around. If any of your floors are uneven, fix them. If there are any pets around you, be aware of where they are. Review your medicines with your doctor. Some medicines can make you feel dizzy. This can increase your chance of falling. Ask your doctor what other things that you can do to help prevent falls. This information is not intended to replace advice given to you by your health care provider. Make sure you discuss any questions you have with your health care provider. Document Released: 07/08/2009 Document Revised: 02/17/2016 Document Reviewed: 10/16/2014 Elsevier Interactive Patient Education  2017 [employer].

## 2021-11-01 ENCOUNTER — Encounter: Payer: Self-pay | Admitting: Internal Medicine

## 2021-11-01 ENCOUNTER — Other Ambulatory Visit: Payer: Self-pay

## 2021-11-01 ENCOUNTER — Ambulatory Visit (INDEPENDENT_AMBULATORY_CARE_PROVIDER_SITE_OTHER): Payer: Medicare PPO | Admitting: Internal Medicine

## 2021-11-01 VITALS — BP 124/78 | HR 59 | Resp 18 | Ht 63.0 in | Wt 174.0 lb

## 2021-11-01 DIAGNOSIS — E7800 Pure hypercholesterolemia, unspecified: Secondary | ICD-10-CM | POA: Diagnosis not present

## 2021-11-01 DIAGNOSIS — M150 Primary generalized (osteo)arthritis: Secondary | ICD-10-CM

## 2021-11-01 DIAGNOSIS — M159 Polyosteoarthritis, unspecified: Secondary | ICD-10-CM | POA: Diagnosis not present

## 2021-11-01 DIAGNOSIS — I10 Essential (primary) hypertension: Secondary | ICD-10-CM

## 2021-11-01 DIAGNOSIS — Z0000 Encounter for general adult medical examination without abnormal findings: Secondary | ICD-10-CM | POA: Diagnosis not present

## 2021-11-01 DIAGNOSIS — M810 Age-related osteoporosis without current pathological fracture: Secondary | ICD-10-CM | POA: Diagnosis not present

## 2021-11-01 LAB — COMPREHENSIVE METABOLIC PANEL
ALT: 19 U/L (ref 0–35)
AST: 22 U/L (ref 0–37)
Albumin: 4.2 g/dL (ref 3.5–5.2)
Alkaline Phosphatase: 56 U/L (ref 39–117)
BUN: 13 mg/dL (ref 6–23)
CO2: 33 mEq/L — ABNORMAL HIGH (ref 19–32)
Calcium: 9.8 mg/dL (ref 8.4–10.5)
Chloride: 102 mEq/L (ref 96–112)
Creatinine, Ser: 0.7 mg/dL (ref 0.40–1.20)
GFR: 88.04 mL/min (ref >60.00)
Glucose, Bld: 86 mg/dL (ref 70–99)
Potassium: 3.8 mEq/L (ref 3.5–5.1)
Sodium: 140 mEq/L (ref 135–145)
Total Bilirubin: 0.7 mg/dL (ref 0.2–1.2)
Total Protein: 8.1 g/dL (ref 6.0–8.3)

## 2021-11-01 LAB — LIPID PANEL
Cholesterol: 150 mg/dL (ref 0–200)
HDL: 62.4 mg/dL (ref >39.00)
LDL Cholesterol: 79 mg/dL (ref 0–99)
NonHDL: 88.03
Total CHOL/HDL Ratio: 2
Triglycerides: 47 mg/dL (ref 0.0–149.0)
VLDL: 9.4 mg/dL (ref 0.0–40.0)

## 2021-11-01 LAB — CBC
HCT: 42.7 % (ref 36.0–46.0)
Hemoglobin: 13.7 g/dL (ref 12.0–15.0)
MCHC: 32.1 g/dL (ref 30.0–36.0)
MCV: 81.3 fl (ref 78.0–100.0)
Platelets: 171 10*3/uL (ref 150.0–400.0)
RBC: 5.25 Mil/uL — ABNORMAL HIGH (ref 3.87–5.11)
RDW: 14.4 % (ref 11.5–15.5)
WBC: 3.7 10*3/uL — ABNORMAL LOW (ref 4.0–10.5)

## 2021-11-01 LAB — VITAMIN D 25 HYDROXY (VIT D DEFICIENCY, FRACTURES): VITD: 56.56 ng/mL (ref 30.00–100.00)

## 2021-11-01 LAB — HEMOGLOBIN A1C: Hgb A1c MFr Bld: 6 % (ref 4.6–6.5)

## 2021-11-01 NOTE — Progress Notes (Signed)
° °  Subjective:   Patient ID: Amy Haynes, female    DOB: 09/21/1951, 71 y.o.   MRN: [id number]  HPI The patient is a 71 YO female coming in for physical.   PMH, [place], social history reviewed and updated  Review of Systems  Constitutional: Negative.   HENT: Negative.    Eyes: Negative.   Respiratory:  Negative for cough, chest tightness and shortness of breath.   Cardiovascular:  Negative for chest pain, palpitations and leg swelling.  Gastrointestinal:  Negative for abdominal distention, abdominal pain, constipation, diarrhea, nausea and vomiting.  Musculoskeletal: Negative.   Skin: Negative.   Neurological: Negative.   Psychiatric/Behavioral: Negative.     Objective:  Physical Exam Constitutional:      Appearance: She is well-developed.  HENT:     Head: Normocephalic and atraumatic.  Cardiovascular:     Rate and Rhythm: Normal rate and regular rhythm.  Pulmonary:     Effort: Pulmonary effort is normal. No respiratory distress.     Breath sounds: Normal breath sounds. No wheezing or rales.  Abdominal:     General: Bowel sounds are normal. There is no distension.     Palpations: Abdomen is soft.     Tenderness: There is no abdominal tenderness. There is no rebound.  Musculoskeletal:     Cervical back: Normal range of motion.  Skin:    General: Skin is warm and dry.  Neurological:     Mental Status: She is alert and oriented to person, place, and time.     Coordination: Coordination normal.    Vitals:   11/01/21 1016  BP: 124/78  Pulse: (!) 59  Resp: 18  SpO2: 90%  Weight: 174 lb (78.9 kg)  Height: 5\' 3"  (1.6 m)    This visit occurred during the SARS-CoV-2 public health emergency.  Safety protocols were in place, including screening questions prior to the visit, additional usage of staff PPE, and extensive cleaning of exam room while observing appropriate contact time as indicated for disinfecting solutions.   Assessment & Plan:

## 2021-11-01 NOTE — Patient Instructions (Signed)
We will check the labs today. 

## 2021-11-02 NOTE — Assessment & Plan Note (Signed)
Flu shot counseled. Covid-19 counseled booster. Pneumonia complete. Shingrix counseled declines. Tetanus due 2025. Colonoscopy due 2025. Mammogram due 2023, pap smear aged out and dexa due 2024. Counseled about sun safety and mole surveillance. Counseled about the dangers of distracted driving. Given 10 year screening recommendations.

## 2021-11-02 NOTE — Assessment & Plan Note (Signed)
Due for DEXA 2024.

## 2021-11-02 NOTE — Assessment & Plan Note (Signed)
BP at goal on amlodipine/benazepril 10/20 mg daily and hctz 25 mg daily. Checking CMP and adjust as needed.

## 2021-11-02 NOTE — Assessment & Plan Note (Signed)
Taking simvastatin 20 mg daily. Checking lipid panel and adjust as needed.

## 2021-11-02 NOTE — Assessment & Plan Note (Signed)
Overall stable and using otc medications rarely.

## 2021-12-15 IMAGING — MG MM DIGITAL SCREENING BILAT W/ TOMO AND CAD
8 series · 8 of 24 positions shown · non-contrast
Comparison: Previous exam(s).

CLINICAL DATA: Screening.

EXAM:
DIGITAL SCREENING BILATERAL MAMMOGRAM WITH TOMOSYNTHESIS AND CAD
TECHNIQUE: Bilateral screening digital craniocaudal and mediolateral oblique
mammograms were obtained. Bilateral screening digital breast
tomosynthesis was performed. The images were evaluated with
computer-aided detection.

[L CC synth-2D]
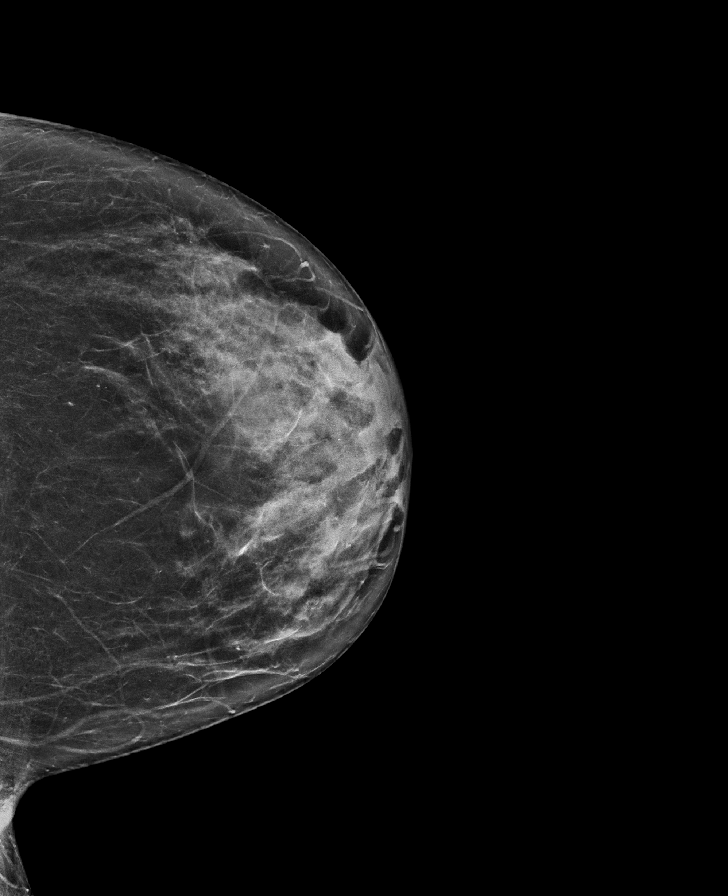

[L MLO synth-2D]
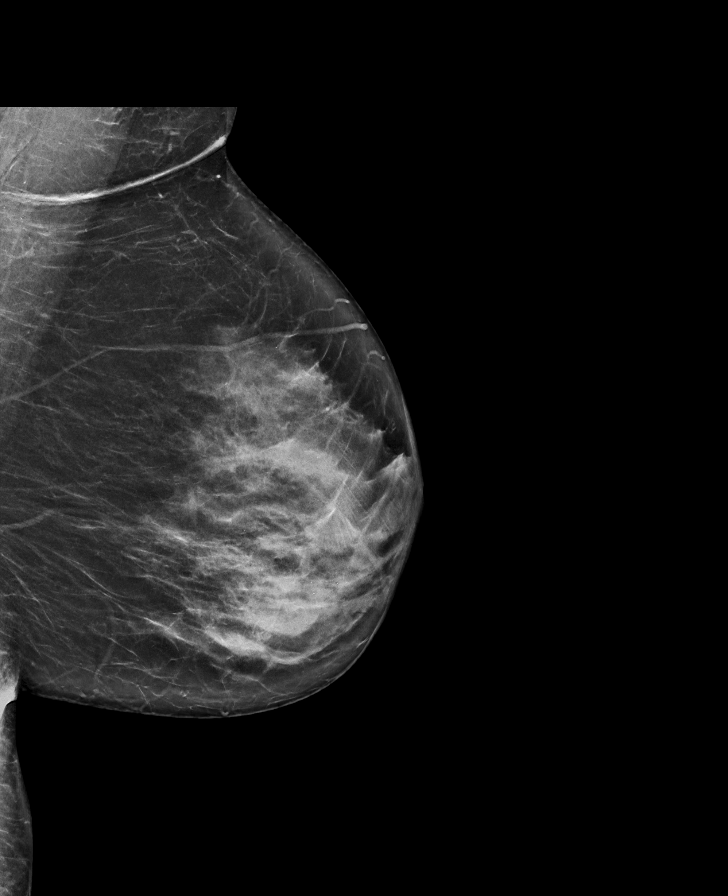

[R CC synth-2D]
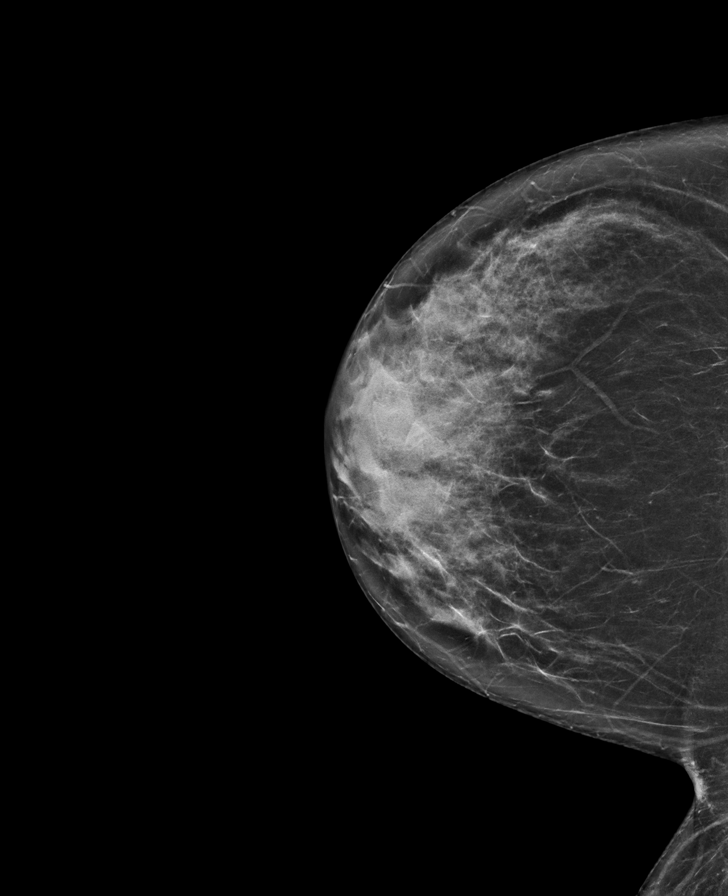

[R MLO synth-2D]
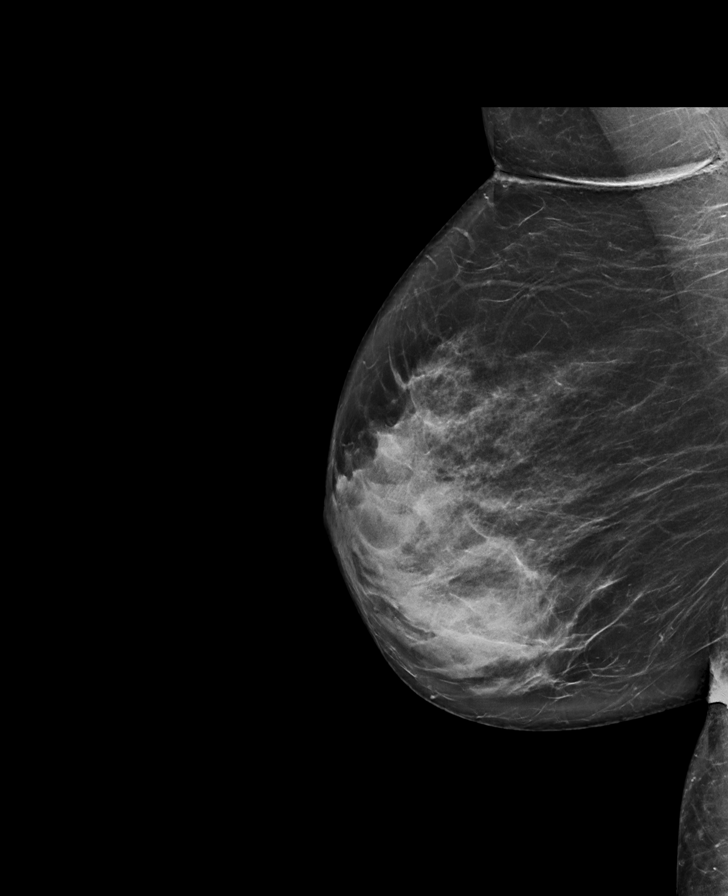

[R MLO tomo · tomo slice 41/80.0]
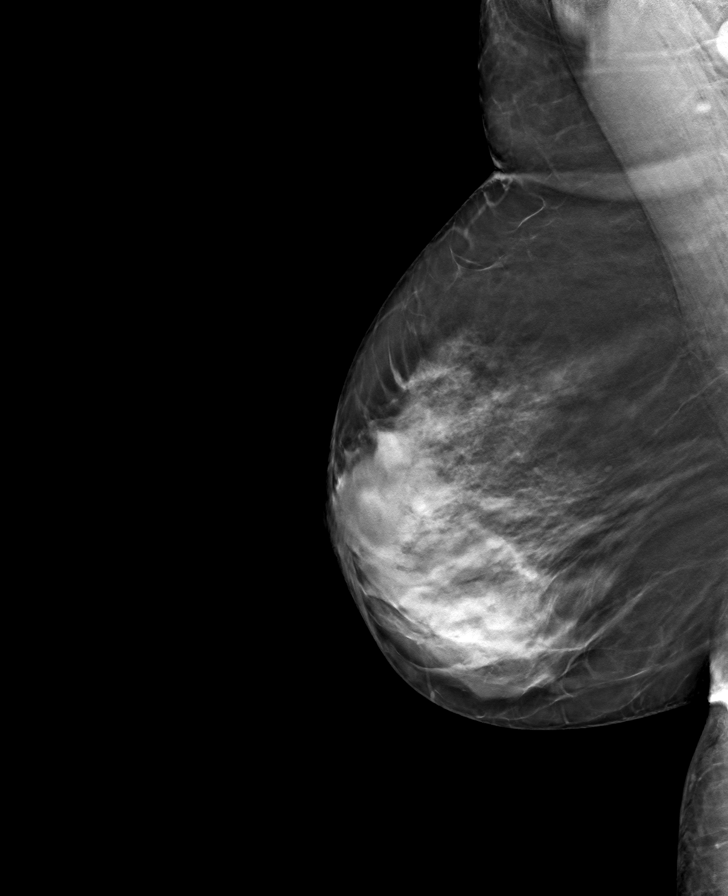

[L CC tomo · tomo slice 35/68.0]
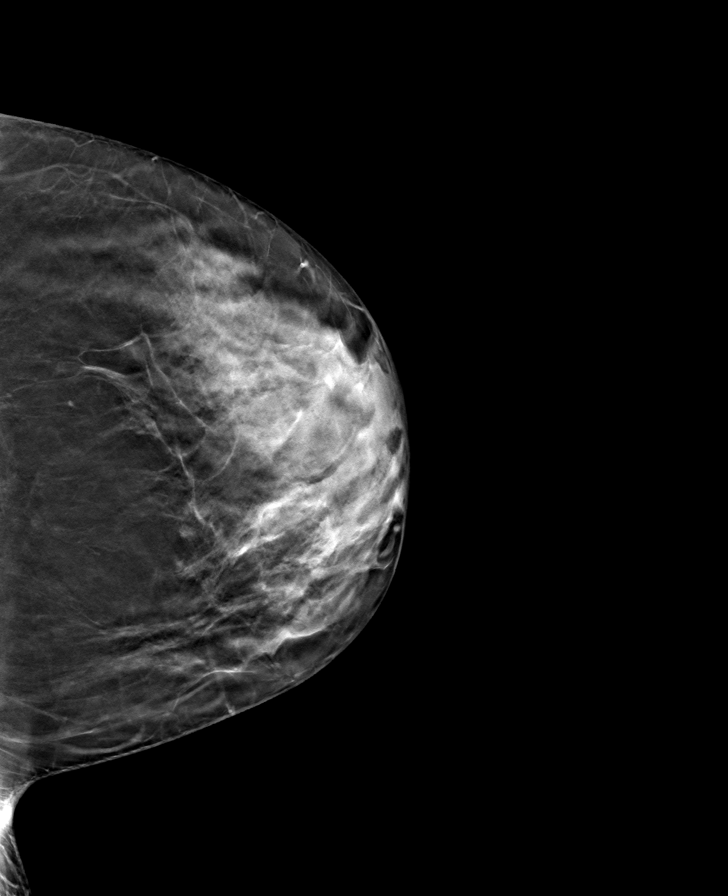

[L MLO tomo · tomo slice 41/82.0]
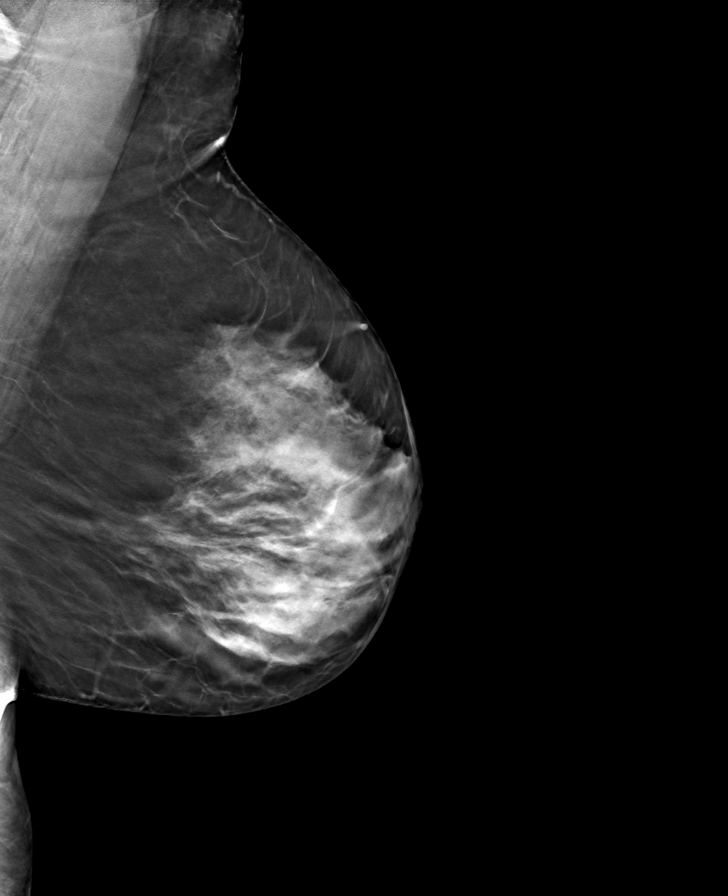

[R CC tomo · tomo slice 34/67.0]
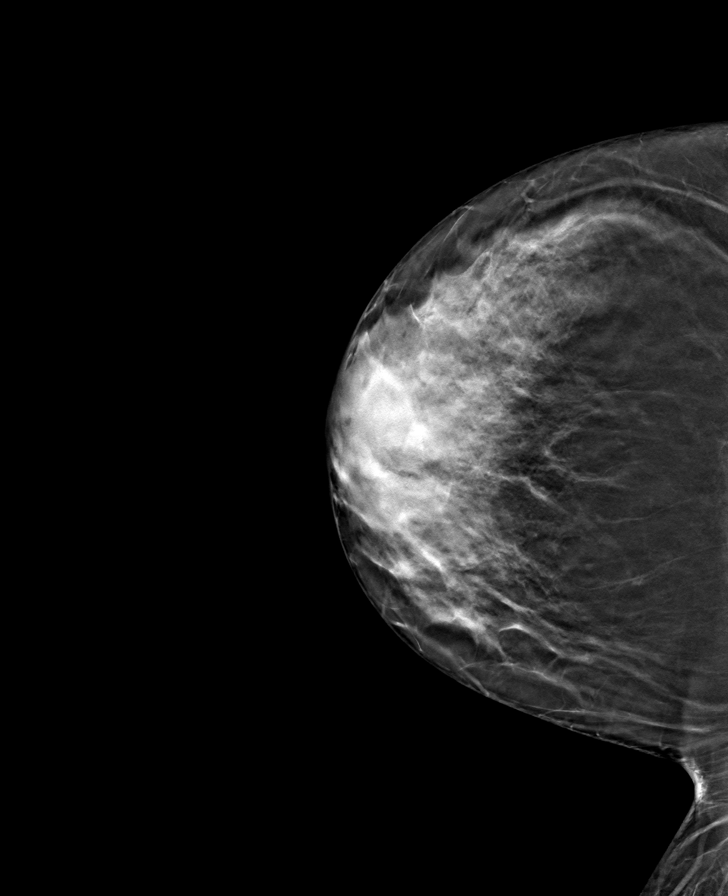

[8 of 24 positions shown; findings below may reference images not displayed]

ACR Breast Density Category c: The breast tissue is heterogeneously
dense, which may obscure small masses.
FINDINGS: There are no findings suspicious for malignancy.
IMPRESSION: No mammographic evidence of malignancy. A result letter of this
screening mammogram will be mailed directly to the patient.

RECOMMENDATION:
Screening mammogram in one year. (Code:[id number]-BC3)

BI-RADS CATEGORY  1: Negative.

## 2022-01-02 ENCOUNTER — Other Ambulatory Visit: Payer: Self-pay | Admitting: Internal Medicine

## 2022-01-02 DIAGNOSIS — E7800 Pure hypercholesterolemia, unspecified: Secondary | ICD-10-CM

## 2022-01-02 DIAGNOSIS — I10 Essential (primary) hypertension: Secondary | ICD-10-CM

## 2022-02-09 ENCOUNTER — Other Ambulatory Visit: Payer: Self-pay | Admitting: Internal Medicine

## 2022-02-09 DIAGNOSIS — Z1231 Encounter for screening mammogram for malignant neoplasm of breast: Secondary | ICD-10-CM

## 2022-03-27 ENCOUNTER — Ambulatory Visit
Admission: RE | Admit: 2022-03-27 | Discharge: 2022-03-27 | Disposition: A | Discharge status: Home / Self Care | Payer: Medicare PPO | Source: Ambulatory Visit | Attending: Internal Medicine | Admitting: Internal Medicine

## 2022-03-27 DIAGNOSIS — Z1231 Encounter for screening mammogram for malignant neoplasm of breast: Secondary | ICD-10-CM

## 2022-05-09 ENCOUNTER — Encounter: Payer: Self-pay | Admitting: Internal Medicine

## 2022-05-09 ENCOUNTER — Ambulatory Visit (INDEPENDENT_AMBULATORY_CARE_PROVIDER_SITE_OTHER): Payer: Medicare PPO | Admitting: Internal Medicine

## 2022-05-09 VITALS — BP 118/82 | HR 51 | Temp 98.4°F | Ht 63.0 in | Wt 171.0 lb

## 2022-05-09 DIAGNOSIS — R1030 Lower abdominal pain, unspecified: Secondary | ICD-10-CM | POA: Insufficient documentation

## 2022-05-09 NOTE — Progress Notes (Signed)
   Subjective:   Patient ID: Amy Haynes, female    DOB: 03-14-1951, 71 y.o.   MRN: [id number]  Abdominal Pain Pertinent negatives include no constipation, diarrhea, nausea or vomiting.   The patient is a 71 YO female coming for low abdomen/pelvis discomfort. Some post menopausal bleeding 2020 evaluation normal and again late 2022 evaluation normal. Since May has been having cyclical cramping without bleeding like a period low abdomen.   Review of Systems  Constitutional: Negative.   HENT: Negative.    Eyes: Negative.   Respiratory:  Negative for cough, chest tightness and shortness of breath.   Cardiovascular:  Negative for chest pain, palpitations and leg swelling.  Gastrointestinal:  Positive for abdominal pain. Negative for abdominal distention, constipation, diarrhea, nausea and vomiting.  Musculoskeletal: Negative.   Skin: Negative.   Neurological: Negative.   Psychiatric/Behavioral: Negative.      Objective:  Physical Exam Constitutional:      Appearance: She is well-developed.  HENT:     Head: Normocephalic and atraumatic.  Cardiovascular:     Rate and Rhythm: Normal rate and regular rhythm.  Pulmonary:     Effort: Pulmonary effort is normal. No respiratory distress.     Breath sounds: Normal breath sounds. No wheezing or rales.  Abdominal:     General: Bowel sounds are normal. There is no distension.     Palpations: Abdomen is soft.     Tenderness: There is no abdominal tenderness. There is no rebound.  Musculoskeletal:     Cervical back: Normal range of motion.  Skin:    General: Skin is warm and dry.  Neurological:     Mental Status: She is alert and oriented to person, place, and time.     Coordination: Coordination normal.     Vitals:   05/09/22 0815  BP: 118/82  Pulse: (!) 51  Temp: 98.4 F (36.9 C)  TempSrc: Oral  SpO2: 94%  Weight: 171 lb (77.6 kg)  Height: '5\' 3"'$  (1.6 m)    Assessment & Plan:

## 2022-05-09 NOTE — Patient Instructions (Signed)
We will get a CT scan of the stomach.

## 2022-05-09 NOTE — Assessment & Plan Note (Signed)
It is unclear if this is related to reproductive organs. She has had several episodes of post-menopausal bleeding. She has had at least 2 evaluation with most recent US and biopsy Oct 2022 normal. She is now having cyclical abdominal pain without bleeding since May 2023. Up to date on colonoscopy last 2020 due 2025. Ordered CT abdomen/pelvis to assess both reproductive and non-reproductive causes of this pain.

## 2022-06-05 ENCOUNTER — Ambulatory Visit
Admission: RE | Admit: 2022-06-05 | Discharge: 2022-06-05 | Disposition: A | Discharge status: Home / Self Care | Payer: Medicare PPO | Source: Ambulatory Visit | Attending: Internal Medicine | Admitting: Internal Medicine

## 2022-06-05 DIAGNOSIS — R109 Unspecified abdominal pain: Secondary | ICD-10-CM | POA: Diagnosis not present

## 2022-06-05 DIAGNOSIS — N281 Cyst of kidney, acquired: Secondary | ICD-10-CM | POA: Diagnosis not present

## 2022-06-05 DIAGNOSIS — R1030 Lower abdominal pain, unspecified: Secondary | ICD-10-CM

## 2022-06-06 ENCOUNTER — Other Ambulatory Visit: Payer: Self-pay | Admitting: Internal Medicine

## 2022-06-06 DIAGNOSIS — N2889 Other specified disorders of kidney and ureter: Secondary | ICD-10-CM

## 2022-06-30 ENCOUNTER — Ambulatory Visit
Admission: RE | Admit: 2022-06-30 | Discharge: 2022-06-30 | Disposition: A | Discharge status: Home / Self Care | Payer: Medicare PPO | Source: Ambulatory Visit | Attending: Internal Medicine | Admitting: Internal Medicine

## 2022-06-30 DIAGNOSIS — I700 Atherosclerosis of aorta: Secondary | ICD-10-CM | POA: Diagnosis not present

## 2022-06-30 DIAGNOSIS — N2889 Other specified disorders of kidney and ureter: Secondary | ICD-10-CM

## 2022-06-30 DIAGNOSIS — N281 Cyst of kidney, acquired: Secondary | ICD-10-CM | POA: Diagnosis not present

## 2022-06-30 DIAGNOSIS — K449 Diaphragmatic hernia without obstruction or gangrene: Secondary | ICD-10-CM | POA: Diagnosis not present

## 2022-06-30 DIAGNOSIS — J8410 Pulmonary fibrosis, unspecified: Secondary | ICD-10-CM | POA: Diagnosis not present

## 2022-06-30 MED ORDER — IOPAMIDOL (ISOVUE-300) INJECTION 61%
100.0000 mL | Freq: Once | INTRAVENOUS | Status: AC | PRN
  Administered 2022-06-30: 100 mL via INTRAVENOUS

## 2022-07-01 ENCOUNTER — Other Ambulatory Visit: Payer: Self-pay | Admitting: Internal Medicine

## 2022-07-01 DIAGNOSIS — E7800 Pure hypercholesterolemia, unspecified: Secondary | ICD-10-CM

## 2022-07-01 DIAGNOSIS — I10 Essential (primary) hypertension: Secondary | ICD-10-CM

## 2022-07-03 ENCOUNTER — Other Ambulatory Visit: Payer: Self-pay | Admitting: Internal Medicine

## 2022-07-03 DIAGNOSIS — N289 Disorder of kidney and ureter, unspecified: Secondary | ICD-10-CM

## 2022-07-06 ENCOUNTER — Encounter: Payer: Self-pay | Admitting: Internal Medicine

## 2022-07-06 DIAGNOSIS — H40033 Anatomical narrow angle, bilateral: Secondary | ICD-10-CM | POA: Diagnosis not present

## 2022-07-06 DIAGNOSIS — H04123 Dry eye syndrome of bilateral lacrimal glands: Secondary | ICD-10-CM | POA: Diagnosis not present

## 2022-07-17 NOTE — Telephone Encounter (Signed)
Pt request for wanting labs and Imaging test and results send to Dr. Milford Cage Urologist has been send via my-chart and also in physical forms faxed

## 2022-07-27 DIAGNOSIS — C642 Malignant neoplasm of left kidney, except renal pelvis: Secondary | ICD-10-CM | POA: Diagnosis not present

## 2022-07-27 DIAGNOSIS — D49512 Neoplasm of unspecified behavior of left kidney: Secondary | ICD-10-CM | POA: Diagnosis not present

## 2022-08-01 ENCOUNTER — Other Ambulatory Visit: Payer: Self-pay | Admitting: Urology

## 2022-08-02 ENCOUNTER — Telehealth: Payer: Self-pay | Admitting: Internal Medicine

## 2022-08-02 NOTE — Telephone Encounter (Signed)
Received surgical clearance form through fax from [facility]. Forms were printed and placed in Dr. Nathanial Millman box at the front.

## 2022-08-03 NOTE — Telephone Encounter (Signed)
Papers were signed and faxed back over today

## 2022-08-07 DIAGNOSIS — J479 Bronchiectasis, uncomplicated: Secondary | ICD-10-CM | POA: Diagnosis not present

## 2022-08-07 DIAGNOSIS — D49512 Neoplasm of unspecified behavior of left kidney: Secondary | ICD-10-CM | POA: Diagnosis not present

## 2022-08-07 DIAGNOSIS — J181 Lobar pneumonia, unspecified organism: Secondary | ICD-10-CM | POA: Diagnosis not present

## 2022-08-07 DIAGNOSIS — R911 Solitary pulmonary nodule: Secondary | ICD-10-CM | POA: Diagnosis not present

## 2022-08-07 LAB — HM DEXA SCAN

## 2022-09-05 DIAGNOSIS — D49512 Neoplasm of unspecified behavior of left kidney: Secondary | ICD-10-CM | POA: Diagnosis not present

## 2022-09-05 DIAGNOSIS — N390 Urinary tract infection, site not specified: Secondary | ICD-10-CM | POA: Diagnosis not present

## 2022-09-06 NOTE — Patient Instructions (Addendum)
DUE TO COVID-19 ONLY TWO VISITORS  (aged 70 and older)  ARE ALLOWED TO COME WITH YOU AND STAY IN THE WAITING ROOM ONLY DURING PRE OP AND PROCEDURE.   **NO VISITORS ARE ALLOWED IN THE SHORT STAY AREA OR RECOVERY ROOM!!**  IF YOU WILL BE ADMITTED INTO THE HOSPITAL YOU ARE ALLOWED ONLY FOUR SUPPORT PEOPLE DURING VISITATION HOURS ONLY (7 AM -8PM)   The support person(s) must pass our screening, gel in and out, and wear a mask at all times, including in the patient's room. Patients must also wear a mask when staff or their support person are in the room. Visitors GUEST BADGE MUST BE WORN VISIBLY  One adult visitor may remain with you overnight and MUST be in the room by 8 P.M.     Your procedure is scheduled on: 09/13/22   Report to [facility] Main Entrance    Report to admitting at  6:15 AM   Call this number if you have problems the morning of surgery [phone]   Do not eat food :After Midnight.on the 20/19/23 switch to clear liquids                    until __5:30____ AM/  DAY OF SURGERY  Water Black Coffee (sugar ok, NO MILK/CREAM OR CREAMERS)  Tea (sugar ok, NO MILK/CREAM OR CREAMERS) regular and decaf                             Plain Jell-O (NO RED)                                           Fruit ices (not with fruit pulp, NO RED)                                     Popsicles (NO RED)                                                                  Juice: apple, WHITE grape, WHITE cranberry Sports drinks like Gatorade (NO RED)                        If you have questions, please contact your surgeon's office.   FOLLOW BOWEL PREP AND ANY ADDITIONAL PRE OP INSTRUCTIONS YOU RECEIVED FROM YOUR SURGEON'S OFFICE!!!     Oral Hygiene is also important to reduce your risk of infection.                                    Remember - BRUSH YOUR TEETH THE MORNING OF SURGERY WITH YOUR REGULAR TOOTHPASTE  DENTURES WILL BE REMOVED PRIOR TO SURGERY PLEASE DO NOT APPLY "Poly  grip" OR ADHESIVES!!!   Do NOT smoke after Midnight   Take these medicines the morning of surgery with A SIP OF WATER: none   Bring CPAP mask and tubing day of surgery.  You may not have any metal on your body including hair pins, jewelry, and body piercing             Do not wear make-up, lotions, powders, perfumes/cologne, or deodorant  Do not wear nail polish including gel and S&S, artificial/acrylic nails, or any other type of covering on natural nails including finger and toenails. If you have artificial nails, gel coating, etc. that needs to be removed by a nail salon please have this removed prior to surgery or surgery may need to be canceled/ delayed if the surgeon/ anesthesia feels like they are unable to be safely monitored.   Do not shave  48 hours prior to surgery.     Do not bring valuables to the hospital. [place].   Contacts, glasses, or bridgework may not be worn into surgery.   Bring small overnight bag day of surgery.   DO NOT [place]. PHARMACY WILL DISPENSE MEDICATIONS LISTED ON YOUR MEDICATION LIST TO YOU DURING YOUR ADMISSION [place]!     Special Instructions: Bring a copy of your healthcare power of attorney and living will documents  the day of surgery if you haven't scanned them before.              Please read over the following fact sheets you were given: IF YOU HAVE QUESTIONS ABOUT YOUR PRE-OP INSTRUCTIONS PLEASE CALL [phone]    [facility] - Preparing for Surgery Before surgery, you can play an important role.  Because skin is not sterile, your skin needs to be as free of germs as possible.  You can reduce the number of germs on your skin by washing with CHG (chlorahexidine gluconate) soap before surgery.  CHG is an antiseptic cleaner which kills germs and bonds with the skin to continue killing germs even after washing. Please  DO NOT use if you have an allergy to CHG or antibacterial soaps.  If your skin becomes reddened/irritated stop using the CHG and inform your nurse when you arrive at Short Stay. Do not shave (including legs and underarms) for at least 48 hours prior to the first CHG shower. Please follow these instructions carefully:  1.  Shower with CHG Soap the night before surgery and the  morning of Surgery.  2.  If you choose to wash your hair, wash your hair first as usual with your  normal  shampoo.  3.  After you shampoo, rinse your hair and body thoroughly to remove the  shampoo.                            4.  Use CHG as you would any other liquid soap.  You can apply chg directly  to the skin and wash                       Gently with a scrungie or clean washcloth.  5.  Apply the CHG Soap to your body ONLY FROM THE NECK DOWN.   Do not use on face/ open                           Wound or open sores. Avoid contact with eyes, ears mouth and genitals (private parts).  Wash face,  Genitals (private parts) with your normal soap.             6.  Wash thoroughly, paying special attention to the area where your surgery  will be performed.  7.  Thoroughly rinse your body with warm water from the neck down.  8.  DO NOT shower/wash with your normal soap after using and rinsing off  the CHG Soap.             9.  Pat yourself dry with a clean towel.            10.  Wear clean pajamas.            11.  Place clean sheets on your bed the night of your first shower and do not  sleep with pets. Day of Surgery : Do not apply any lotions/deodorants the morning of surgery.  Please wear clean clothes to the hospital/surgery center.  FAILURE TO FOLLOW THESE INSTRUCTIONS MAY RESULT IN THE CANCELLATION OF YOUR SURGERY    ________________________________________________________________________

## 2022-09-07 ENCOUNTER — Encounter (HOSPITAL_COMMUNITY)
Admission: RE | Admit: 2022-09-07 | Discharge: 2022-09-07 | Disposition: A | Discharge status: Home / Self Care | Payer: Medicare PPO | Source: Ambulatory Visit | Attending: Urology | Admitting: Urology

## 2022-09-07 ENCOUNTER — Encounter (HOSPITAL_COMMUNITY): Payer: Self-pay

## 2022-09-07 ENCOUNTER — Other Ambulatory Visit: Payer: Self-pay

## 2022-09-07 DIAGNOSIS — I10 Essential (primary) hypertension: Secondary | ICD-10-CM | POA: Insufficient documentation

## 2022-09-07 DIAGNOSIS — Z01818 Encounter for other preprocedural examination: Secondary | ICD-10-CM | POA: Diagnosis not present

## 2022-09-07 HISTORY — DX: Trigger finger, unspecified finger: M65.30

## 2022-09-07 LAB — CBC
HCT: 42.8 % (ref 36.0–46.0)
Hemoglobin: 13.6 g/dL (ref 12.0–15.0)
MCH: 26.6 pg (ref 26.0–34.0)
MCHC: 31.8 g/dL (ref 30.0–36.0)
MCV: 83.8 fL (ref 80.0–100.0)
Platelets: 213 10*3/uL (ref 150–400)
RBC: 5.11 MIL/uL (ref 3.87–5.11)
RDW: 14.6 % (ref 11.5–15.5)
WBC: 4.6 10*3/uL (ref 4.0–10.5)
nRBC: 0 % (ref 0.0–0.2)

## 2022-09-07 LAB — BASIC METABOLIC PANEL
Anion gap: 8 (ref 5–15)
BUN: 21 mg/dL (ref 8–23)
CO2: 28 mmol/L (ref 22–32)
Calcium: 9.3 mg/dL (ref 8.9–10.3)
Chloride: 106 mmol/L (ref 98–111)
Creatinine, Ser: 0.84 mg/dL (ref 0.44–1.00)
GFR, Estimated: >60 mL/min (ref >60)
Glucose, Bld: 110 mg/dL — ABNORMAL HIGH (ref 70–99)
Potassium: 3.5 mmol/L (ref 3.5–5.1)
Sodium: 142 mmol/L (ref 135–145)

## 2022-09-07 NOTE — Progress Notes (Signed)
Anesthesia note:  Bowel prep reminder:  none  PCP - Dr. Lesly Rubenstein Cardiologist -none Other-   Chest x-ray - no EKG - 09/07/22-chart Stress Test - no ECHO - no Cardiac Cath - no CABG-no Pacemaker/ICD device last checked:NA  Sleep Study - no CPAP -   Pt is pre diabetic-no CBG at PAT visit- Fasting Blood Sugar at home- Checks Blood Sugar _____  Blood Thinner:no Blood Thinner Instructions: Aspirin Instructions: Last Dose:  Anesthesia review: No    Patient denies shortness of breath, fever, cough and chest pain at PAT appointment. Pt has no SOB with activities. She knows to have a clear liquid diet the day before surgery then NPO after midnight.   Patient verbalized understanding of instructions that were given to them at the PAT appointment. Patient was also instructed that they will need to review over the PAT instructions again at home before surgery.yes

## 2022-09-12 NOTE — Anesthesia Preprocedure Evaluation (Signed)
Anesthesia Evaluation  Patient identified by MRN, date of birth, ID band Patient awake    Reviewed: Allergy & Precautions, NPO status , Patient's Chart, lab work & pertinent test results  Airway Mallampati: II  TM Distance: >3 FB Neck ROM: Full    Dental no notable dental hx. (+) Teeth Intact, Dental Advisory Given   Pulmonary neg pulmonary ROS   Pulmonary exam normal breath sounds clear to auscultation       Cardiovascular hypertension, Pt. on medications Normal cardiovascular exam Rhythm:Regular Rate:Normal  HLD  Stress Test 2021  normal   Neuro/Psych  PSYCHIATRIC DISORDERS Anxiety     negative neurological ROS     GI/Hepatic negative GI ROS, Neg liver ROS,,,  Endo/Other  negative endocrine ROS    Renal/GU negative Renal ROS  negative genitourinary   Musculoskeletal  (+) Arthritis ,    Abdominal   Peds  Hematology negative hematology ROS (+)   Anesthesia Other Findings   Reproductive/Obstetrics                             Anesthesia Physical Anesthesia Plan  ASA: 2  Anesthesia Plan: General   Post-op Pain Management: Tylenol PO (pre-op)*, Ketamine IV* and Lidocaine infusion*   Induction: Intravenous  PONV Risk Score and Plan: 3 and Dexamethasone, Ondansetron and Treatment may vary due to age or medical condition  Airway Management Planned: Oral ETT  Additional Equipment:   Intra-op Plan:   Post-operative Plan: Extubation in OR  Informed Consent: I have reviewed the patients History and Physical, chart, labs and discussed the procedure including the risks, benefits and alternatives for the proposed anesthesia with the patient or authorized representative who has indicated his/her understanding and acceptance.     Dental advisory given  Plan Discussed with: CRNA  Anesthesia Plan Comments: (2 IVs)       Anesthesia Quick Evaluation

## 2022-09-13 ENCOUNTER — Ambulatory Visit (HOSPITAL_COMMUNITY): Payer: Medicare PPO | Admitting: Anesthesiology

## 2022-09-13 ENCOUNTER — Encounter (HOSPITAL_COMMUNITY)
Admission: RE | Disposition: A | Discharge status: Home / Self Care | Payer: Self-pay | Source: Home / Self Care | Attending: Urology

## 2022-09-13 ENCOUNTER — Ambulatory Visit (HOSPITAL_BASED_OUTPATIENT_CLINIC_OR_DEPARTMENT_OTHER): Payer: Medicare PPO | Admitting: Anesthesiology

## 2022-09-13 ENCOUNTER — Observation Stay (HOSPITAL_COMMUNITY)
Admission: RE | Admit: 2022-09-13 | Discharge: 2022-09-15 | Disposition: A | Discharge status: Home / Self Care | Payer: Medicare PPO | Attending: Urology | Admitting: Urology

## 2022-09-13 ENCOUNTER — Other Ambulatory Visit: Payer: Self-pay

## 2022-09-13 ENCOUNTER — Encounter (HOSPITAL_COMMUNITY): Payer: Self-pay | Admitting: Urology

## 2022-09-13 DIAGNOSIS — I10 Essential (primary) hypertension: Secondary | ICD-10-CM | POA: Insufficient documentation

## 2022-09-13 DIAGNOSIS — N2889 Other specified disorders of kidney and ureter: Secondary | ICD-10-CM | POA: Diagnosis not present

## 2022-09-13 DIAGNOSIS — C642 Malignant neoplasm of left kidney, except renal pelvis: Secondary | ICD-10-CM | POA: Diagnosis not present

## 2022-09-13 DIAGNOSIS — Z79899 Other long term (current) drug therapy: Secondary | ICD-10-CM | POA: Diagnosis not present

## 2022-09-13 HISTORY — PX: ROBOTIC ASSITED PARTIAL NEPHRECTOMY: SHX6087

## 2022-09-13 LAB — TYPE AND SCREEN
ABO/RH(D): A POS
Antibody Screen: NEGATIVE

## 2022-09-13 LAB — ABO/RH: ABO/RH(D): A POS

## 2022-09-13 LAB — HEMOGLOBIN AND HEMATOCRIT, BLOOD
HCT: 38.3 % (ref 36.0–46.0)
Hemoglobin: 12.2 g/dL (ref 12.0–15.0)

## 2022-09-13 SURGERY — NEPHRECTOMY, PARTIAL, ROBOT-ASSISTED
Anesthesia: General | Site: Flank | Laterality: Left

## 2022-09-13 MED ORDER — MORPHINE SULFATE (PF) 2 MG/ML IV SOLN: 2.0000 mg | INTRAVENOUS | Status: DC | PRN

## 2022-09-13 MED ORDER — DOCUSATE SODIUM 100 MG PO CAPS
100.0000 mg | ORAL_CAPSULE | Freq: Two times a day (BID) | ORAL | Status: DC
  Administered 2022-09-13 – 2022-09-15 (×4): 100 mg via ORAL
  Filled 2022-09-13 (×4): qty 1

## 2022-09-13 MED ORDER — ROCURONIUM BROMIDE 10 MG/ML (PF) SYRINGE
PREFILLED_SYRINGE | INTRAVENOUS | Status: AC
  Filled 2022-09-13: qty 10

## 2022-09-13 MED ORDER — ONDANSETRON HCL 4 MG/2ML IJ SOLN: 4.0000 mg | INTRAMUSCULAR | Status: DC | PRN

## 2022-09-13 MED ORDER — LIDOCAINE 20MG/ML (2%) 15 ML SYRINGE OPTIME
INTRAMUSCULAR | Status: DC | PRN
  Administered 2022-09-13: 1.5 mg/kg/h via INTRAVENOUS

## 2022-09-13 MED ORDER — FENTANYL CITRATE PF 50 MCG/ML IJ SOSY
25.0000 ug | PREFILLED_SYRINGE | INTRAMUSCULAR | Status: DC | PRN
  Administered 2022-09-13 (×3): 50 ug via INTRAVENOUS

## 2022-09-13 MED ORDER — PROPOFOL 10 MG/ML IV BOLUS
INTRAVENOUS | Status: AC
  Filled 2022-09-13: qty 20

## 2022-09-13 MED ORDER — DIPHENHYDRAMINE HCL 12.5 MG/5ML PO ELIX: 12.5000 mg | ORAL_SOLUTION | Freq: Four times a day (QID) | ORAL | Status: DC | PRN

## 2022-09-13 MED ORDER — SODIUM CHLORIDE (PF) 0.9 % IJ SOLN
INTRAMUSCULAR | Status: DC | PRN
  Administered 2022-09-13: 20 mL

## 2022-09-13 MED ORDER — ORAL CARE MOUTH RINSE: 15.0000 mL | Freq: Once | OROMUCOSAL | Status: AC

## 2022-09-13 MED ORDER — LACTATED RINGERS IV SOLN: INTRAVENOUS | Status: DC

## 2022-09-13 MED ORDER — BUPIVACAINE LIPOSOME 1.3 % IJ SUSP
INTRAMUSCULAR | Status: DC | PRN
  Administered 2022-09-13: 20 mL

## 2022-09-13 MED ORDER — ONDANSETRON HCL 4 MG/2ML IJ SOLN
INTRAMUSCULAR | Status: DC | PRN
  Administered 2022-09-13: 4 mg via INTRAVENOUS

## 2022-09-13 MED ORDER — SUGAMMADEX SODIUM 200 MG/2ML IV SOLN
INTRAVENOUS | Status: DC | PRN
  Administered 2022-09-13: 200 mg via INTRAVENOUS

## 2022-09-13 MED ORDER — DOCUSATE SODIUM 100 MG PO CAPS: 100.0000 mg | ORAL_CAPSULE | Freq: Two times a day (BID) | ORAL | Status: DC

## 2022-09-13 MED ORDER — SODIUM CHLORIDE 0.9% FLUSH: 3.0000 mL | INTRAVENOUS | Status: DC | PRN

## 2022-09-13 MED ORDER — FENTANYL CITRATE PF 50 MCG/ML IJ SOSY
PREFILLED_SYRINGE | INTRAMUSCULAR | Status: AC
  Filled 2022-09-13: qty 1

## 2022-09-13 MED ORDER — SODIUM CHLORIDE 0.9 % IV SOLN: 250.0000 mL | INTRAVENOUS | Status: DC | PRN

## 2022-09-13 MED ORDER — "VISTASEAL 4 ML SINGLE DOSE KIT "
4.0000 mL | PACK | Freq: Once | CUTANEOUS | Status: AC
  Administered 2022-09-13: 4 mL via TOPICAL
  Filled 2022-09-13: qty 4

## 2022-09-13 MED ORDER — OXYCODONE HCL 5 MG PO TABS
5.0000 mg | ORAL_TABLET | ORAL | Status: DC | PRN
  Administered 2022-09-14 – 2022-09-15 (×2): 5 mg via ORAL
  Filled 2022-09-13 (×4): qty 1

## 2022-09-13 MED ORDER — BUPIVACAINE LIPOSOME 1.3 % IJ SUSP
INTRAMUSCULAR | Status: AC
  Filled 2022-09-13: qty 20

## 2022-09-13 MED ORDER — ACETAMINOPHEN 500 MG PO TABS
1000.0000 mg | ORAL_TABLET | Freq: Once | ORAL | Status: AC
  Administered 2022-09-13: 1000 mg via ORAL
  Filled 2022-09-13: qty 2

## 2022-09-13 MED ORDER — LACTATED RINGERS IR SOLN
Status: DC | PRN
  Administered 2022-09-13: 1000 mL

## 2022-09-13 MED ORDER — FENTANYL CITRATE PF 50 MCG/ML IJ SOSY
PREFILLED_SYRINGE | INTRAMUSCULAR | Status: AC
  Filled 2022-09-13: qty 2

## 2022-09-13 MED ORDER — CEFAZOLIN SODIUM-DEXTROSE 1-4 GM/50ML-% IV SOLN
1.0000 g | Freq: Three times a day (TID) | INTRAVENOUS | Status: AC
  Administered 2022-09-13 – 2022-09-14 (×2): 1 g via INTRAVENOUS
  Filled 2022-09-13 (×2): qty 50

## 2022-09-13 MED ORDER — FENTANYL CITRATE (PF) 100 MCG/2ML IJ SOLN
INTRAMUSCULAR | Status: DC | PRN
  Administered 2022-09-13 (×2): 50 ug via INTRAVENOUS

## 2022-09-13 MED ORDER — ROCURONIUM BROMIDE 10 MG/ML (PF) SYRINGE
PREFILLED_SYRINGE | INTRAVENOUS | Status: DC | PRN
  Administered 2022-09-13: 10 mg via INTRAVENOUS
  Administered 2022-09-13: 70 mg via INTRAVENOUS
  Administered 2022-09-13: 10 mg via INTRAVENOUS

## 2022-09-13 MED ORDER — ACETAMINOPHEN 500 MG PO TABS
1000.0000 mg | ORAL_TABLET | Freq: Four times a day (QID) | ORAL | Status: AC
  Administered 2022-09-13 – 2022-09-14 (×4): 1000 mg via ORAL
  Filled 2022-09-13 (×4): qty 2

## 2022-09-13 MED ORDER — CHLORHEXIDINE GLUCONATE 0.12 % MT SOLN
15.0000 mL | Freq: Once | OROMUCOSAL | Status: AC
  Administered 2022-09-13: 15 mL via OROMUCOSAL

## 2022-09-13 MED ORDER — SODIUM CHLORIDE (PF) 0.9 % IJ SOLN
INTRAMUSCULAR | Status: AC
  Filled 2022-09-13: qty 20

## 2022-09-13 MED ORDER — LIDOCAINE 2% (20 MG/ML) 5 ML SYRINGE
INTRAMUSCULAR | Status: DC | PRN
  Administered 2022-09-13: 60 mg via INTRAVENOUS

## 2022-09-13 MED ORDER — KETAMINE HCL 10 MG/ML IJ SOLN
INTRAMUSCULAR | Status: DC | PRN
  Administered 2022-09-13: 15 mg via INTRAVENOUS
  Administered 2022-09-13: 10 mg via INTRAVENOUS
  Administered 2022-09-13: 5 mg via INTRAVENOUS
  Administered 2022-09-13 (×2): 10 mg via INTRAVENOUS

## 2022-09-13 MED ORDER — KETAMINE HCL 10 MG/ML IJ SOLN
INTRAMUSCULAR | Status: AC
  Filled 2022-09-13: qty 1

## 2022-09-13 MED ORDER — DEXAMETHASONE SODIUM PHOSPHATE 10 MG/ML IJ SOLN
INTRAMUSCULAR | Status: DC | PRN
  Administered 2022-09-13: 10 mg via INTRAVENOUS

## 2022-09-13 MED ORDER — STERILE WATER FOR IRRIGATION IR SOLN
Status: DC | PRN
  Administered 2022-09-13: 1000 mL

## 2022-09-13 MED ORDER — DEXTROSE-NACL 5-0.45 % IV SOLN: INTRAVENOUS | Status: DC

## 2022-09-13 MED ORDER — DEXAMETHASONE SODIUM PHOSPHATE 10 MG/ML IJ SOLN
INTRAMUSCULAR | Status: AC
  Filled 2022-09-13: qty 1

## 2022-09-13 MED ORDER — CEFAZOLIN SODIUM-DEXTROSE 2-4 GM/100ML-% IV SOLN
2.0000 g | INTRAVENOUS | Status: AC
  Administered 2022-09-13: 2 g via INTRAVENOUS
  Filled 2022-09-13: qty 100

## 2022-09-13 MED ORDER — PROPOFOL 10 MG/ML IV BOLUS
INTRAVENOUS | Status: DC | PRN
  Administered 2022-09-13: 150 mg via INTRAVENOUS

## 2022-09-13 MED ORDER — HYDROCODONE-ACETAMINOPHEN 5-325 MG PO TABS: 1.0000 | ORAL_TABLET | Freq: Four times a day (QID) | ORAL | 0 refills | Status: DC | PRN

## 2022-09-13 MED ORDER — FENTANYL CITRATE (PF) 100 MCG/2ML IJ SOLN
INTRAMUSCULAR | Status: AC
  Filled 2022-09-13: qty 2

## 2022-09-13 MED ORDER — LACTATED RINGERS IV SOLN: INTRAVENOUS | Status: DC | PRN

## 2022-09-13 MED ORDER — ONDANSETRON HCL 4 MG/2ML IJ SOLN
INTRAMUSCULAR | Status: AC
  Filled 2022-09-13: qty 2

## 2022-09-13 MED ORDER — DIPHENHYDRAMINE HCL 50 MG/ML IJ SOLN: 12.5000 mg | Freq: Four times a day (QID) | INTRAMUSCULAR | Status: DC | PRN

## 2022-09-13 MED ORDER — LIDOCAINE HCL (PF) 2 % IJ SOLN
INTRAMUSCULAR | Status: AC
  Filled 2022-09-13: qty 5

## 2022-09-13 MED ORDER — PHENYLEPHRINE 80 MCG/ML (10ML) SYRINGE FOR IV PUSH (FOR BLOOD PRESSURE SUPPORT)
PREFILLED_SYRINGE | INTRAVENOUS | Status: DC | PRN
  Administered 2022-09-13 (×2): 80 ug via INTRAVENOUS
  Administered 2022-09-13: 160 ug via INTRAVENOUS

## 2022-09-13 MED ORDER — PHENYLEPHRINE 80 MCG/ML (10ML) SYRINGE FOR IV PUSH (FOR BLOOD PRESSURE SUPPORT)
PREFILLED_SYRINGE | INTRAVENOUS | Status: AC
  Filled 2022-09-13: qty 10

## 2022-09-13 MED ORDER — SODIUM CHLORIDE 0.9% FLUSH
3.0000 mL | Freq: Two times a day (BID) | INTRAVENOUS | Status: DC
  Administered 2022-09-13 – 2022-09-15 (×4): 3 mL via INTRAVENOUS

## 2022-09-13 MED ORDER — MIDAZOLAM HCL 5 MG/5ML IJ SOLN
INTRAMUSCULAR | Status: DC | PRN
  Administered 2022-09-13: 2 mg via INTRAVENOUS

## 2022-09-13 MED ORDER — MIDAZOLAM HCL 2 MG/2ML IJ SOLN
INTRAMUSCULAR | Status: AC
  Filled 2022-09-13: qty 2

## 2022-09-13 MED ORDER — LIDOCAINE HCL 2 % IJ SOLN
INTRAMUSCULAR | Status: AC
  Filled 2022-09-13: qty 20

## 2022-09-13 SURGICAL SUPPLY — 74 items
APPLICATOR SURGIFLO ENDO (HEMOSTASIS) IMPLANT
APPLICATOR VISTASEAL 35 (MISCELLANEOUS) ×1 IMPLANT
BAG COUNTER SPONGE SURGICOUNT (BAG) IMPLANT
CHLORAPREP W/TINT 26 (MISCELLANEOUS) ×1 IMPLANT
CLIP LIGATING HEM O LOK PURPLE (MISCELLANEOUS) ×2 IMPLANT
CLIP LIGATING HEMO LOK XL GOLD (MISCELLANEOUS) IMPLANT
CLIP LIGATING HEMO O LOK GREEN (MISCELLANEOUS) ×1 IMPLANT
CLIP SUT LAPRA TY ABSORB (SUTURE) ×2 IMPLANT
COVER SURGICAL LIGHT HANDLE (MISCELLANEOUS) ×1 IMPLANT
COVER TIP SHEARS 8 DVNC (MISCELLANEOUS) ×1 IMPLANT
COVER TIP SHEARS 8MM DA VINCI (MISCELLANEOUS) ×1
CUTTER ECHEON FLEX ENDO 45 340 (ENDOMECHANICALS) IMPLANT
DERMABOND ADVANCED .7 DNX12 (GAUZE/BANDAGES/DRESSINGS) ×1 IMPLANT
DRAIN CHANNEL 15F RND FF 3/16 (WOUND CARE) IMPLANT
DRAPE ARM DVNC X/XI (DISPOSABLE) ×4 IMPLANT
DRAPE COLUMN DVNC XI (DISPOSABLE) ×1 IMPLANT
DRAPE DA VINCI XI ARM (DISPOSABLE) ×4
DRAPE DA VINCI XI COLUMN (DISPOSABLE) ×1
DRAPE INCISE IOBAN 66X45 STRL (DRAPES) ×1 IMPLANT
DRAPE SHEET LG 3/4 BI-LAMINATE (DRAPES) ×1 IMPLANT
ELECT PENCIL ROCKER SW 15FT (MISCELLANEOUS) ×1 IMPLANT
ELECT REM PT RETURN 15FT ADLT (MISCELLANEOUS) ×1 IMPLANT
EVACUATOR SILICONE 100CC (DRAIN) IMPLANT
GAUZE 4X4 16PLY ~~LOC~~+RFID DBL (SPONGE) IMPLANT
GLOVE BIO SURGEON STRL SZ 6.5 (GLOVE) ×1 IMPLANT
GLOVE BIOGEL PI IND STRL 8 (GLOVE) ×1 IMPLANT
GLOVE SURG LX STRL 7.5 STRW (GLOVE) ×2 IMPLANT
GOWN SRG XL LVL 4 BRTHBL STRL (GOWNS) ×1 IMPLANT
GOWN STRL NON-REIN XL LVL4 (GOWNS) ×1
GOWN STRL REUS W/ TWL XL LVL3 (GOWN DISPOSABLE) ×2 IMPLANT
GOWN STRL REUS W/TWL XL LVL3 (GOWN DISPOSABLE) ×2
HEMOSTAT SURGICEL 4X8 (HEMOSTASIS) ×1 IMPLANT
HOLDER FOLEY CATH W/STRAP (MISCELLANEOUS) ×1 IMPLANT
IRRIG SUCT STRYKERFLOW 2 WTIP (MISCELLANEOUS) ×1
IRRIGATION SUCT STRKRFLW 2 WTP (MISCELLANEOUS) ×1 IMPLANT
KIT BASIN OR (CUSTOM PROCEDURE TRAY) ×1 IMPLANT
KIT TURNOVER KIT A (KITS) IMPLANT
LOOP VESSEL MAXI BLUE (MISCELLANEOUS) IMPLANT
MARKER SKIN DUAL TIP RULER LAB (MISCELLANEOUS) ×1 IMPLANT
NDL INSUFFLATION 14GA 120MM (NEEDLE) ×1 IMPLANT
NEEDLE INSUFFLATION 14GA 120MM (NEEDLE) ×1 IMPLANT
PROTECTOR NERVE ULNAR (MISCELLANEOUS) ×2 IMPLANT
RELOAD STAPLE 45 2.6 WHT THIN (STAPLE) IMPLANT
SCISSORS LAP 5X45 EPIX DISP (ENDOMECHANICALS) ×1 IMPLANT
SEAL CANN UNIV 5-8 DVNC XI (MISCELLANEOUS) ×4 IMPLANT
SEAL XI 5MM-8MM UNIVERSAL (MISCELLANEOUS) ×4
SET TUBE SMOKE EVAC HIGH FLOW (TUBING) ×1 IMPLANT
SLEEVE ADV FIXATION 12X100MM (TROCAR) IMPLANT
SOLUTION ELECTROLUBE (MISCELLANEOUS) ×1 IMPLANT
SPIKE FLUID TRANSFER (MISCELLANEOUS) ×1 IMPLANT
STAPLE RELOAD 45 WHT (STAPLE) IMPLANT
STAPLE RELOAD 45MM WHITE (STAPLE)
SURGIFLO W/THROMBIN 8M KIT (HEMOSTASIS) IMPLANT
SUT ETHILON 2 0 PSLX (SUTURE) IMPLANT
SUT MNCRL AB 4-0 PS2 18 (SUTURE) ×2 IMPLANT
SUT PDS AB 0 CT1 36 (SUTURE) IMPLANT
SUT V-LOC BARB 180 2/0GR6 GS22 (SUTURE)
SUT V-LOC BARB 180 2/0GR9 GS23 (SUTURE) ×1
SUT VIC AB 1 CT1 36 (SUTURE) ×4 IMPLANT
SUT VIC AB 4-0 SH 27 (SUTURE) ×1
SUT VIC AB 4-0 SH 27XBRD (SUTURE) IMPLANT
SUT VICRYL 0 UR6 27IN ABS (SUTURE) IMPLANT
SUT VLOC BARB 180 ABS3/0GR12 (SUTURE) ×2
SUTURE V-LC BRB 180 2/0GR6GS22 (SUTURE) IMPLANT
SUTURE V-LC BRB 180 2/0GR9GS23 (SUTURE) ×1 IMPLANT
SUTURE VLOC BRB 180 ABS3/0GR12 (SUTURE) ×2 IMPLANT
SYS BAG RETRIEVAL 10MM (BASKET) ×1
SYSTEM BAG RETRIEVAL 10MM (BASKET) ×1 IMPLANT
TOWEL OR 17X26 10 PK STRL BLUE (TOWEL DISPOSABLE) ×1 IMPLANT
TRAY FOLEY MTR SLVR 16FR STAT (SET/KITS/TRAYS/PACK) ×1 IMPLANT
TRAY LAPAROSCOPIC (CUSTOM PROCEDURE TRAY) ×1 IMPLANT
TROCAR Z THREAD OPTICAL 12X100 (TROCAR) ×1 IMPLANT
TROCAR Z-THREAD OPTICAL 5X100M (TROCAR) IMPLANT
WATER STERILE IRR 1000ML POUR (IV SOLUTION) ×1 IMPLANT

## 2022-09-13 NOTE — Op Note (Signed)
Operative Note  Preoperative diagnosis:  1.  3.7 cm left renal mass  Postoperative diagnosis: 1.  3.7 cm left renal mass  Procedure(s): 1.  Robot-assisted laparoscopic left partial nephrectomy 2.  Intraoperative ultrasound of single retroperitoneal organ  Surgeon: Ellison Hughs, MD  Assistants: 1.  Debbrah Alar, PA-C  An assistant was required for this surgical procedure.  The duties of the assistant included but were not limited to suctioning, passing suture, camera manipulation, retraction.  This procedure would not be able to be performed without an Environmental consultant.  2.  Lamar Laundry, MD PGY 4 Anesthesia:  General  Complications:  None  EBL: 50 mL  Specimens: 1.  Left renal mass  Drains/Catheters: 1.  Foley catheter  Intraoperative findings:   Grossly negative margins following excision of left upper pole renal mass Left renorrhaphy was hemostatic at the conclusion of the case  Indication:  Amy Haynes is a 71 y.o. female with 3.7 cm Bosniak 4 left renal mass with features concerning for renal cell carcinoma.  She has been consented for the above procedures, voices understanding wishes to proceed.  Description of procedure:  After informed consent was obtained, the patient was brought to the operating room and general endotracheal anesthesia was administered.  A 16 French Foley catheter was then sterilely placed and set to gravity drainage.  The patient was then placed in the right lateral decubitus position and prepped and draped in usual sterile fashion.  A timeout was performed.  An 8 mm incision was then made lateral to the left rectus muscle at the level of the left 12th rib.  Abdominal access was obtained via a Veress needle.  The abdominal cavity was then insufflated up to 15 mmHg.  An 8 mm port was then introduced into the abdominal cavity.  Inspection of the port entry site by the robotic camera revealed no adjacent organ injury.  We then placed 3 additional 8  mm robotic ports to triangulate the left renal hilum.  A 12 mm assistant port was then placed between the carmera port and 3rd robotic arm.  The white line of Toldt along the descending colon was incised sharply and the colon, along with its mesocolonic fat, was reflected medially until the aorta was identified.  We then made a small window adjacent to the lower pole of the left kidney, identifying the left psoas muscle, left ureter and left gonadal vein.  The left ureter and gonadal vein were then reflected anteriorly allowing [place] to then incised the perihilar attachments using electrocautery.  We encountered a small lumbar vein adjacent to the insertion of the left gonadal vein into the left renal vein.  This lumbar vein was ligated with hemo-lock clips in 2 places and incised sharply.  This provided [place] excellent exposure to the left renal hilum.  The perilymphatic tissue surrounding the left renal artery was carefully dissected away, creating a window to place a bulldog clamp later in the procedure.  The anterior portion of Gerota's fascia was incised, allowing reflection the perinephric fat medially and laterally until there was adequate exposure of the upper pole left renal mass.  Intraoperative ultrasound confirmed the heterogenous echogenicity of the lesion compared to the remainder of the renal parenchyma and allowed identification of the depth/borders of the mass, which were demarcated using electrocautery along the renal capsule.  We then exposed the left renal artery and placed a bulldog clamp, marking warm ischemia time.  The left kidney immediately became ischemic and pale in appearance.  The left renal mass was then sharply excised with minimal blood loss.  After the mass was free, it was placed in the left upper quadrant to be retrieved later on during the operation.    A 1 cm area of collecting system violation within the deep margin of the resection bed was identified and reapproximated using a  running 4-0 Vicryl suture.  The renorrhaphy was then performed using a 3-0 V-lock in the deep layer of the renal parenchyma.  The bulldog clamp was then removed marking warm ischemia time at 22 minutes.   A series of 1-0 Vicryl sutures with Hem-o-lok clips acting as a buttress were then used to reapproximate the renal capsule.  There did not appear to be any obvious bleeding around the renal hilum nor surrounding our repair.  The incised Gerota's fascia overlying the mass was then reapproximated using a running 2-0 V lock suture.  The mass was then placed in an Endo Catch bag.  Surgicel and Vistaseal were then applied to the resection bed.  The robot was then de-docked and the camera was then reinserted into the assistant port. Laparoscopic graspers were then used to grab the string of the Endo Catch bag, which was brought out through the 12 mm assistant port.  The abdomen was then desufflated and all ports were removed.  The assistant port incision was then extended approximately 1-2 cm and the left renal mass, within the Endo Catch bag, was removed and sent to pathology for permanent section.  The fascia within the assistant port incision was then reapproximated using a 0 Vicryl suture.  The remainder of the incisions were then closed using 4-0 Monocryl and dressed appropriately.  Patient tolerated the procedure well and was transferred to the postanesthesia unit in stable condition.  Plan: Monitor on the floor

## 2022-09-13 NOTE — H&P (Addendum)
PRE-OP H&P  Office Visit Report     09/05/2022   --------------------------------------------------------------------------------   Amy Haynes  MRN: [id number]  DOB: 1951-01-12, 71 year old Female  PRIMARY CARE:  Pricilla Holm  PRIMARY CARE FAX:  [phone]  REFERRING:  Glena Norfolk. Lovena Neighbours, MD  PROVIDER:  Ellison Hughs, M.D.  TREATING:  Jiles Crocker, NP  LOCATION:  [facility] [phone]     --------------------------------------------------------------------------------   CC/HPI: Left renal mass   Amy Haynes is a 71 year old female who was found to have a 3.7 cm Bosniak 4 cyst involving the upper pole of the left kidney. The lesion was found on CT abdomen and pelvis with and without contrast from 06/30/2022. The lesion was initially discovered on CT without contrast from 06/05/2022 during evaluation for lower abdominal.   -Anatomy: 3.7 cm mesophytic complex cystic lesion involving the upper pole of the left kidney. Single artery and vein. No abnormalities are seen involving the right kidney  -Personal/family history of GU malignancies: Denies  -Smoking history: Non-smoker  -Prior abdominal surgeries: C-section  -Renal function: BMP from 10/2021 was WNL  -History of kidney stones: Denies   09/05/2022: Patient here today for preoperative exam prior to undergoing robotic assisted left partial nephrectomy on 12/20 with Dr. Lovena Neighbours. Denies any changes in past medical history, prescription medications taken on daily basis, no interval surgical or procedural intervention. She has had no new or worsening lower urinary tract symptoms from baseline including absence of any dysuria or gross hematuria. She denies any recent fever/chills, nausea/vomiting, chest pain or shortness of breath. Denies any unilateral lower back or flank pain suggestive of obstructive uropathy.     ALLERGIES: tramadol - Dizziness, heart races    MEDICATIONS: Simvastatin 20 mg  tablet  Amlodipine Besylate-Benazepril 10 mg-20 mg capsule  Coq10  Multivitamin  Omega 3  Polyethylene Glycol  Triamterene-Hydrochlorothiazid 37.5 mg-25 mg tablet  Vitamin D3     GU PSH: None     PSH Notes: cesarean section x 2    NON-GU PSH: None   GU PMH: Left renal neoplasm - 08/07/2022, - 07/27/2022    NON-GU PMH: Anxiety Arthritis GERD Hypercholesterolemia Hypertension    FAMILY HISTORY: 2 daughters - Daughter Heart Disease - Runs in Family Kidney Failure - Runs in Family   SOCIAL HISTORY: Marital Status: Divorced Preferred Language: English; Ethnicity: Not Hispanic Or Latino; Race: Black or African American Current Smoking Status: Patient does not smoke anymore.   Tobacco Use Assessment Completed: Used Tobacco in last 30 days? Has never drank.  Drinks 1 caffeinated drink per day.    REVIEW OF SYSTEMS:    GU Review Female:   Patient reports get up at night to urinate. Patient denies frequent urination, hard to postpone urination, burning /pain with urination, leakage of urine, stream starts and stops, trouble starting your stream, have to strain to urinate, and being pregnant.  Gastrointestinal (Upper):   Patient denies nausea, vomiting, and indigestion/ heartburn.  Gastrointestinal (Lower):   Patient denies diarrhea and constipation.  Constitutional:   Patient denies fever, night sweats, weight loss, and fatigue.  Skin:   Patient denies skin rash/ lesion and itching.  Eyes:   Patient denies blurred vision and double vision.  Ears/ Nose/ Throat:   Patient denies sore throat and sinus problems.  Hematologic/Lymphatic:   Patient denies swollen glands and easy bruising.  Cardiovascular:   Patient denies leg swelling and chest pains.  Respiratory:   Patient denies cough and shortness of breath.  Endocrine:   Patient denies excessive thirst.  Musculoskeletal:   Patient reports back pain and joint pain.   Neurological:   Patient denies headaches and dizziness.   Psychologic:   Patient denies depression and anxiety.   VITAL SIGNS:      09/05/2022 12:52 PM  BP 117/74 mmHg  Pulse 54 /min  Temperature 98.6 F / 37 C   MULTI-SYSTEM PHYSICAL EXAMINATION:    Constitutional: Well-nourished. No physical deformities. Normally developed. Good grooming.  Neck: Neck symmetrical, not swollen. Normal tracheal position.  Respiratory: No labored breathing, no use of accessory muscles.   Cardiovascular: Normal temperature, normal extremity pulses, no swelling, no varicosities.  Skin: No paleness, no jaundice, no cyanosis. No lesion, no ulcer, no rash.  Neurologic / Psychiatric: Oriented to time, oriented to place, oriented to person. No depression, no anxiety, no agitation.  Gastrointestinal: No mass, no tenderness, no rigidity, non obese abdomen.  Musculoskeletal: Normal gait and station of head and neck.     Complexity of Data:  Source Of History:  Patient, Medical Record Summary  Lab Test Review:   BMP  Records Review:   Previous Doctor Records, Previous Hospital Records, Previous Patient Records  Urine Test Review:   Urinalysis  X-Ray Review: C.T. Chest: Reviewed Report.  C.T. Abdomen/Pelvis: Reviewed Films. Reviewed Report.     09/05/22  Urinalysis  Urine Appearance Clear   Urine Specimen Voided   Urine Color Yellow   Urine Glucose Neg   Urine Bilirubin Neg   Urine Ketones Neg   Urine Specific Gravity 1.015   Urine Blood Neg   Urine pH 7.5   Urine Protein Neg   Urine Urobilinogen 0.2   Urine Nitrites Neg   Urine Leukocyte Esterase Neg    PROCEDURES:          Urinalysis - 81003 Dipstick Dipstick Cont'd  Specimen: Voided Bilirubin: Neg  Color: Yellow Ketones: Neg  Appearance: Clear Blood: Neg  Specific Gravity: 1.015 Protein: Neg  pH: 7.5 Urobilinogen: 0.2  Glucose: Neg Nitrites: Neg    Leukocyte Esterase: Neg    ASSESSMENT:      ICD-10 Details  1 GU:   Left renal neoplasm - D49.512 Left, Chronic, Threat to Bodily Function  2  NON-GU:   Encounter for other preprocedural examination - Z01.818 Undiagnosed New Problem   PLAN:           Orders Labs Urine Culture          Schedule Return Visit/Planned Activity: Keep Scheduled Appointment - Follow up MD, Schedule Surgery          Document Letter(s):  Created for Patient: Clinical Summary         Notes:   Marland Kitchen UrineAll questions answered to the best my ability regarding the upcoming procedure and expected postoperative course with understanding expressed by the patient present today to serve as a baseline. She will proceed with previously scheduled left robotic assisted partial nephrectomy on 12/20.   -I personally reviewed imaging results and films with the patient. We discussed that the mass in question has features concerning for malignancy. I explained the natural history of presumed renal cell carcinoma. I reviewed the AUA guidelines for evaluation and treatment of the small renal mass. The options of active surveillance, in situ tumor ablation, partial and radical nephrectomy was discussed. The risks of robot-assisted LEFT partial nephrectomy were discussed in detail including but not limited to: negative pathology, open conversion, completion nephrectomy, infection of the urinary tract/skin/abdominal cavity, VTE, MI/CVA,  lymphatic leak, injury to adjacent solid/hollow viscus organs, bleeding requiring a blood transfusion, catastrophic bleeding, hernia formation, need for postoperative angioembolization, urinary leak requiring stent/drain, and other imponderables.         Next Appointment:      Next Appointment: 09/13/2022 08:30 AM    Appointment Type: Surgery     Location: [facility] [phone]    Provider: Ellison Hughs, M.D.    Reason for Visit: [facility] EXT REC LEFT ROBOTIC PARTIAL NEPHRECTOMY W/ [facility]

## 2022-09-13 NOTE — Discharge Instructions (Signed)

## 2022-09-13 NOTE — Transfer of Care (Signed)
Immediate Anesthesia Transfer of Care Note  Patient: Amy Haynes  Procedure(s) Performed: XI ROBOTIC ASSISTED PARTIAL NEPHRECTOMY (Left: Flank)  Patient Location: PACU  Anesthesia Type:General  Level of Consciousness: awake, alert , and oriented  Airway & Oxygen Therapy: Patient Spontanous Breathing and Patient connected to face mask oxygen  Post-op Assessment: Report given to RN and Post -op Vital signs reviewed and stable  Post vital signs: Reviewed and stable  Last Vitals:  Vitals Value Taken Time  BP 123/81 09/13/22 1201  Temp    Pulse 74 09/13/22 1205  Resp 16 09/13/22 1205  SpO2 100 % 09/13/22 1205  Vitals shown include unvalidated device data.  Last Pain:  Vitals:   09/13/22 0639  TempSrc: Oral         Complications: No notable events documented.

## 2022-09-13 NOTE — Anesthesia Procedure Notes (Signed)
Procedure Name: Intubation Date/Time: 09/13/2022 8:39 AM  Performed by: Karron Goens, Lindaann D, CRNAPre-anesthesia Checklist: Patient identified, Emergency Drugs available, Suction available and Patient being monitored Patient Re-evaluated:Patient Re-evaluated prior to induction Oxygen Delivery Method: Circle system utilized Preoxygenation: Pre-oxygenation with 100% oxygen Induction Type: IV induction Ventilation: Mask ventilation without difficulty Laryngoscope Size: Mac and 3 Grade View: Grade I Tube type: Oral Tube size: 7.0 mm Number of attempts: 1 Airway Equipment and Method: Stylet and Oral airway Placement Confirmation: ETT inserted through vocal cords under direct vision, positive ETCO2 and breath sounds checked- equal and bilateral Secured at: 21 cm Tube secured with: Tape Dental Injury: Teeth and Oropharynx as per pre-operative assessment

## 2022-09-13 NOTE — Anesthesia Postprocedure Evaluation (Signed)
Anesthesia Post Note  Patient: Amy Haynes  Procedure(s) Performed: XI ROBOTIC ASSISTED PARTIAL NEPHRECTOMY (Left: Flank)     Patient location during evaluation: PACU Anesthesia Type: General Level of consciousness: awake and alert Pain management: pain level controlled Vital Signs Assessment: post-procedure vital signs reviewed and stable Respiratory status: spontaneous breathing, nonlabored ventilation, respiratory function stable and patient connected to nasal cannula oxygen Cardiovascular status: blood pressure returned to baseline and stable Postop Assessment: no apparent nausea or vomiting Anesthetic complications: no  No notable events documented.  Last Vitals:  Vitals:   09/13/22 1400 09/13/22 1510  BP: 129/81 (!) 140/84  Pulse: 71 87  Resp: 11 15  Temp: (!) 36.3 C 37 C  SpO2: 98%     Last Pain:  Vitals:   09/13/22 1517  TempSrc:   PainSc: 2                  [place] Catarino L Hansel Devan

## 2022-09-14 ENCOUNTER — Encounter (HOSPITAL_COMMUNITY): Payer: Self-pay | Admitting: Urology

## 2022-09-14 DIAGNOSIS — C642 Malignant neoplasm of left kidney, except renal pelvis: Secondary | ICD-10-CM | POA: Diagnosis not present

## 2022-09-14 DIAGNOSIS — Z79899 Other long term (current) drug therapy: Secondary | ICD-10-CM | POA: Diagnosis not present

## 2022-09-14 DIAGNOSIS — I10 Essential (primary) hypertension: Secondary | ICD-10-CM | POA: Diagnosis not present

## 2022-09-14 LAB — SURGICAL PATHOLOGY

## 2022-09-14 LAB — BASIC METABOLIC PANEL
Anion gap: 6 (ref 5–15)
BUN: 8 mg/dL (ref 8–23)
CO2: 26 mmol/L (ref 22–32)
Calcium: 8.4 mg/dL — ABNORMAL LOW (ref 8.9–10.3)
Chloride: 104 mmol/L (ref 98–111)
Creatinine, Ser: 0.8 mg/dL (ref 0.44–1.00)
GFR, Estimated: >60 mL/min (ref >60)
Glucose, Bld: 170 mg/dL — ABNORMAL HIGH (ref 70–99)
Potassium: 3.2 mmol/L — ABNORMAL LOW (ref 3.5–5.1)
Sodium: 136 mmol/L (ref 135–145)

## 2022-09-14 LAB — HEMOGLOBIN AND HEMATOCRIT, BLOOD
HCT: 36.7 % (ref 36.0–46.0)
Hemoglobin: 11.8 g/dL — ABNORMAL LOW (ref 12.0–15.0)

## 2022-09-14 MED ORDER — SODIUM CHLORIDE 0.9 % IV SOLN: INTRAVENOUS | Status: DC

## 2022-09-14 MED ORDER — AMLODIPINE BESYLATE 10 MG PO TABS
10.0000 mg | ORAL_TABLET | Freq: Every day | ORAL | Status: DC
  Administered 2022-09-14 – 2022-09-15 (×2): 10 mg via ORAL
  Filled 2022-09-14 (×2): qty 1

## 2022-09-14 NOTE — Progress Notes (Signed)
The pt's urine is slightly more blood tinged compared to how it appeared in the Foley bag this AM (catheter bag was still in trash bin).  She denies flank pain or dysuria.  Plan to continue to monitor her overnight with IVF and repeat labs in the AM.

## 2022-09-14 NOTE — Progress Notes (Signed)
  Transition of Care (TOC) Screening Note   Patient Details  Name: Amy Haynes Date of Birth: 14-Dec-1950   Transition of Care [facility]) CM/SW Contact:    Dessa Phi, RN Phone Number: 09/14/2022, 12:26 PM    Transition of Care Department [facility]) has reviewed patient and no TOC needs have been identified at this time. We will continue to monitor patient advancement through interdisciplinary progression rounds. If new patient transition needs arise, please place a TOC consult.

## 2022-09-14 NOTE — Discharge Summary (Addendum)
Date of admission: 09/13/2022  Date of discharge: 09/14/2022  Admission diagnosis: Renal mass  Discharge diagnosis: Renal mass  Secondary diagnoses: none  History and Physical: For full details, please see admission history and physical. Briefly, Amy Haynes is a 71 y.o. year old patient with a 3.7cm left renal mass who presented for a scheduled robotic left partial nephrectomy.   Hospital Course: The patient underwent a robotic left partial nephrectomy with Dr. Lovena Neighbours on 09/13/22. She tolerated the procedure well. Post-operatively she was transferred to the floor for routine post-operative care. By POD#1, patient was progressing well. Her pain was well-controlled, she was tolerating a regular diet, and she passed a TOV. She was ambulating without difficulty. Her BP was elevated into the SBP 170s without symptoms. Her home norvasc was resumed. By the afternoon of POD#1, patient met all goals for discharge. Immediately prior to leaving, she voided and urine was more blood tinged than earlier in the AM. Given known entry into collecting system, we opted to monitor overnight for clearance of hematuria. By POD#2, her urine was clear yellow/light pink and her Hgb was stable at 12.0 from 11.8. She was cleared for discharge.  Laboratory values:  Recent Labs    09/13/22 1249 09/14/22 0415  HGB 12.2 11.8*  HCT 38.3 36.7   Recent Labs    09/14/22 0415  CREATININE 0.80    Disposition: Home  Discharge instruction: The patient was instructed to be ambulatory but told to refrain from heavy lifting, strenuous activity, or driving.   Discharge medications:  Allergies as of 09/14/2022       Reactions   Lactose Intolerance (gi)    GI discomfort    Tramadol Nausea And Vomiting   Dizziness, drowsiness        Medication List     STOP taking these medications    CoQ10 100 MG Caps   MULTIVITAMIN & MINERAL PO   VITAMIN K2-VITAMIN D3 PO       TAKE these medications     amLODipine-benazepril 10-20 MG capsule Commonly known as: LOTREL TAKE 1 CAPSULES BY MOUTH DAILY. ANNUAL APPOINTMENT IS DUE IN OCTOBER   docusate sodium 100 MG capsule Commonly known as: COLACE Take 1 capsule (100 mg total) by mouth 2 (two) times daily.   HYDROcodone-acetaminophen 5-325 MG tablet Commonly known as: Norco Take 1-2 tablets by mouth every 6 (six) hours as needed for moderate pain or severe pain.   lactase 3000 units tablet Commonly known as: LACTAID Take 3,000 Units by mouth daily as needed (eating milk products).   OVER THE COUNTER MEDICATION Apply 1 Application topically daily as needed (pain). Arthritis cream   simethicone 125 MG chewable tablet Commonly known as: MYLICON Chew 035 mg by mouth every 6 (six) hours as needed for flatulence.   simvastatin 20 MG tablet Commonly known as: ZOCOR TAKE 1 TABLET(20 MG) BY MOUTH EVERY EVENING   Systane Ultra 0.4-0.3 % Soln Generic drug: Polyethyl Glycol-Propyl Glycol Place 1 drop into both eyes daily as needed (dry eyes).   triamterene-hydrochlorothiazide 37.5-25 MG tablet Commonly known as: MAXZIDE-25 TAKE 1 TABLET BY MOUTH DAILY   Tums Ultra 1000 400 MG chewable tablet Generic drug: calcium elemental as carbonate Chew 1,000 mg by mouth daily as needed for heartburn.        Followup:   Follow-up Information     Ceasar Mons, MD Follow up on 09/28/2022.   Specialty: Urology Why: at 12:45 Contact information: [address]  [place] [phone]

## 2022-09-15 DIAGNOSIS — I10 Essential (primary) hypertension: Secondary | ICD-10-CM | POA: Diagnosis not present

## 2022-09-15 DIAGNOSIS — C642 Malignant neoplasm of left kidney, except renal pelvis: Secondary | ICD-10-CM | POA: Diagnosis not present

## 2022-09-15 DIAGNOSIS — Z79899 Other long term (current) drug therapy: Secondary | ICD-10-CM | POA: Diagnosis not present

## 2022-09-15 LAB — HEMOGLOBIN AND HEMATOCRIT, BLOOD
HCT: 38.5 % (ref 36.0–46.0)
Hemoglobin: 12 g/dL (ref 12.0–15.0)

## 2022-09-15 MED ORDER — ACETAMINOPHEN 500 MG PO TABS
1000.0000 mg | ORAL_TABLET | Freq: Four times a day (QID) | ORAL | Status: DC
  Administered 2022-09-15: 1000 mg via ORAL
  Filled 2022-09-15: qty 2

## 2022-09-28 DIAGNOSIS — C642 Malignant neoplasm of left kidney, except renal pelvis: Secondary | ICD-10-CM | POA: Diagnosis not present

## 2022-10-09 ENCOUNTER — Encounter: Payer: Self-pay | Admitting: Internal Medicine

## 2022-10-09 ENCOUNTER — Ambulatory Visit: Payer: Medicare PPO | Admitting: Internal Medicine

## 2022-10-09 VITALS — BP 140/100 | HR 58 | Temp 97.8°F | Ht 63.0 in | Wt 168.0 lb

## 2022-10-09 DIAGNOSIS — R911 Solitary pulmonary nodule: Secondary | ICD-10-CM

## 2022-10-09 NOTE — Assessment & Plan Note (Signed)
Suspect related to prior sarcoidosis. Nodule 8 mm on recent CT chest 07/2022 was previously 2 cm in 2020. Ordered CT chest without contrast to be done same day as follow up CT abdomen/pelvis imaging for clear cell carcinoma s/p resection May 2024. We discussed likely need for additional follow up in 1 year if stable on upcoming scan.

## 2022-10-09 NOTE — Patient Instructions (Signed)
We have ordered the CT scan of the lungs to be done with the CT abdomen and pelvis.

## 2022-10-09 NOTE — Progress Notes (Signed)
   Subjective:   Patient ID: Amy Haynes, female    DOB: 04/22/1951, 72 y.o.   MRN: [id number]  HPI The patient is a 72 YO female coming in for follow up lesion on CT chest done by urology prior to kidney resection (done 09/13/22).   Review of Systems  Constitutional: Negative.   HENT: Negative.    Eyes: Negative.   Respiratory:  Negative for cough, chest tightness and shortness of breath.   Cardiovascular:  Negative for chest pain, palpitations and leg swelling.  Gastrointestinal:  Negative for abdominal distention, abdominal pain, constipation, diarrhea, nausea and vomiting.  Musculoskeletal: Negative.   Skin: Negative.   Neurological: Negative.   Psychiatric/Behavioral: Negative.      Objective:  Physical Exam Constitutional:      Appearance: She is well-developed.  HENT:     Head: Normocephalic and atraumatic.  Cardiovascular:     Rate and Rhythm: Normal rate and regular rhythm.  Pulmonary:     Effort: Pulmonary effort is normal. No respiratory distress.     Breath sounds: Normal breath sounds. No wheezing or rales.  Abdominal:     General: Bowel sounds are normal. There is no distension.     Palpations: Abdomen is soft.     Tenderness: There is no abdominal tenderness. There is no rebound.  Musculoskeletal:     Cervical back: Normal range of motion.  Skin:    General: Skin is warm and dry.  Neurological:     Mental Status: She is alert and oriented to person, place, and time.     Coordination: Coordination normal.     Vitals:   10/09/22 1038 10/09/22 1044  BP: (!) 140/100 (!) 140/100  Pulse: (!) 58   Temp: 97.8 F (36.6 C)   TempSrc: Oral   SpO2: 99%   Weight: 168 lb (76.2 kg)   Height: '5\' 3"'$  (1.6 m)     Assessment & Plan:  Visit time 20 minutes in face to face communication with patient and coordination of care, additional 5 minutes spent in record review, coordination or care, ordering tests, communicating/referring to other healthcare professionals,  documenting in medical records all on the same day of the visit for total time 25 minutes spent on the visit.

## 2022-10-31 DIAGNOSIS — C642 Malignant neoplasm of left kidney, except renal pelvis: Secondary | ICD-10-CM | POA: Diagnosis not present

## 2022-11-07 ENCOUNTER — Encounter: Payer: Self-pay | Admitting: Internal Medicine

## 2022-11-07 ENCOUNTER — Ambulatory Visit (INDEPENDENT_AMBULATORY_CARE_PROVIDER_SITE_OTHER): Payer: Medicare PPO | Admitting: Internal Medicine

## 2022-11-07 VITALS — BP 120/80 | HR 54 | Temp 97.8°F | Ht 63.0 in | Wt 166.0 lb

## 2022-11-07 DIAGNOSIS — M810 Age-related osteoporosis without current pathological fracture: Secondary | ICD-10-CM | POA: Diagnosis not present

## 2022-11-07 DIAGNOSIS — R7303 Prediabetes: Secondary | ICD-10-CM

## 2022-11-07 DIAGNOSIS — E7800 Pure hypercholesterolemia, unspecified: Secondary | ICD-10-CM

## 2022-11-07 DIAGNOSIS — Z0000 Encounter for general adult medical examination without abnormal findings: Secondary | ICD-10-CM

## 2022-11-07 DIAGNOSIS — I10 Essential (primary) hypertension: Secondary | ICD-10-CM | POA: Diagnosis not present

## 2022-11-07 LAB — COMPREHENSIVE METABOLIC PANEL
ALT: 16 U/L (ref 0–35)
AST: 19 U/L (ref 0–37)
Albumin: 4.2 g/dL (ref 3.5–5.2)
Alkaline Phosphatase: 65 U/L (ref 39–117)
BUN: 19 mg/dL (ref 6–23)
CO2: 29 mEq/L (ref 19–32)
Calcium: 10 mg/dL (ref 8.4–10.5)
Chloride: 99 mEq/L (ref 96–112)
Creatinine, Ser: 0.78 mg/dL (ref 0.40–1.20)
GFR: 76.77 mL/min (ref >60.00)
Glucose, Bld: 85 mg/dL (ref 70–99)
Potassium: 3.6 mEq/L (ref 3.5–5.1)
Sodium: 137 mEq/L (ref 135–145)
Total Bilirubin: 0.6 mg/dL (ref 0.2–1.2)
Total Protein: 7.9 g/dL (ref 6.0–8.3)

## 2022-11-07 LAB — CBC
HCT: 40.6 % (ref 36.0–46.0)
Hemoglobin: 13.4 g/dL (ref 12.0–15.0)
MCHC: 33.1 g/dL (ref 30.0–36.0)
MCV: 81 fl (ref 78.0–100.0)
Platelets: 170 10*3/uL (ref 150.0–400.0)
RBC: 5.02 Mil/uL (ref 3.87–5.11)
RDW: 14.5 % (ref 11.5–15.5)
WBC: 3.5 10*3/uL — ABNORMAL LOW (ref 4.0–10.5)

## 2022-11-07 LAB — LIPID PANEL
Cholesterol: 179 mg/dL (ref 0–200)
HDL: 64.8 mg/dL (ref >39.00)
LDL Cholesterol: 98 mg/dL (ref 0–99)
NonHDL: 113.95
Total CHOL/HDL Ratio: 3
Triglycerides: 81 mg/dL (ref 0.0–149.0)
VLDL: 16.2 mg/dL (ref 0.0–40.0)

## 2022-11-07 LAB — HEMOGLOBIN A1C: Hgb A1c MFr Bld: 5.7 % (ref 4.6–6.5)

## 2022-11-07 MED ORDER — TRIAMTERENE-HCTZ 37.5-25 MG PO TABS: 1.0000 | ORAL_TABLET | Freq: Every day | ORAL | 3 refills | Status: DC

## 2022-11-07 MED ORDER — AMLODIPINE BESY-BENAZEPRIL HCL 10-20 MG PO CAPS: 1.0000 | ORAL_CAPSULE | Freq: Every day | ORAL | 3 refills | Status: DC

## 2022-11-07 MED ORDER — SIMVASTATIN 20 MG PO TABS: 20.0000 mg | ORAL_TABLET | Freq: Every day | ORAL | 3 refills | Status: DC

## 2022-11-07 NOTE — Progress Notes (Signed)
Subjective:   Patient ID: Amy Haynes, female    DOB: [birth year], 72 y.o.   MRN: [id number]  HPI Here for medicare wellness and physical, no new complaints. Please see A/P for status and treatment of chronic medical problems.   Diet: heart healthy Physical activity: active working out 3 times a week Depression/mood screen: negative Hearing: intact to whispered voice, mild loss left ear Visual acuity: grossly normal, performs annual eye exam  ADLs: capable Fall risk: none Home safety: good Cognitive evaluation: intact to orientation, naming, recall and repetition EOL planning: adv directives discussed, in place  [employer] Visit from 11/07/2022 in [place] at [facility] Visit from 11/07/2022 in [place] at [facility]  PHQ-9 Total Score 0         09/14/2022    9:00 PM 09/15/2022    8:34 AM 10/19/2022    9:38 AM 11/03/2022    8:54 AM 11/07/2022   10:43 AM  [place] in the past year?   0 0 0  Was there an injury with Fall?    0 0  Fall Risk Category Calculator    0 0  (RETIRED) Patient Fall Risk Level Moderate fall risk Moderate fall risk     Fall risk Follow up     Falls evaluation completed    I have personally reviewed and have noted 1. The patient's medical and social history - reviewed today no changes 2. Their use of alcohol, tobacco or illicit drugs 3. Their current medications and supplements 4. The patient's functional ability including ADL's, fall risks, home safety risks and hearing or visual impairment. 5. Diet and physical activities 6. Evidence for depression or mood disorders 7. Care team reviewed and updated 8.  The patient is not on an opioid pain medication.  Patient Care Team: Hoyt Koch, MD as PCP - General (Internal Medicine) Past Medical History:  Diagnosis Date   Anxiety    Arthritis    knees   Diverticulosis of colon     History of bronchitis    Hypercholesteremia    Hypertension    Microcytosis    Osteoporosis    Triggering of finger    Rt 3rd finger   Past Surgical History:  Procedure Laterality Date   CESAREAN SECTION     x2   COLONOSCOPY  2020   with polypectomy   CYST EXCISION Left 2022   face   FOOT SURGERY Left 1991   MULTIPLE TOOTH EXTRACTIONS     ROBOTIC ASSITED PARTIAL NEPHRECTOMY Left 09/13/2022   Procedure: XI ROBOTIC ASSISTED PARTIAL NEPHRECTOMY;  Surgeon: Ceasar Mons, MD;  Location: [facility];  Service: Urology;  Laterality: Left;  4 HRS   Family History  Problem Relation Age of Onset   Arthritis Mother    Hypertension Mother    Cancer Mother 63       breast   Breast cancer Mother    Kidney disease Sister    Diabetes Sister    Heart disease Sister    Colon cancer Brother    Colon polyps Neg Hx    Esophageal cancer Neg Hx    Rectal cancer Neg Hx    Stomach cancer Neg Hx    Review of Systems  Constitutional: Negative.   HENT: Negative.    Eyes: Negative.   Respiratory:  Negative for cough, chest tightness  and shortness of breath.   Cardiovascular:  Negative for chest pain, palpitations and leg swelling.  Gastrointestinal:  Negative for abdominal distention, abdominal pain, constipation, diarrhea, nausea and vomiting.  Musculoskeletal: Negative.   Skin: Negative.   Neurological: Negative.   Psychiatric/Behavioral: Negative.      Objective:  Physical Exam Constitutional:      Appearance: She is well-developed.  HENT:     Head: Normocephalic and atraumatic.  Cardiovascular:     Rate and Rhythm: Normal rate and regular rhythm.  Pulmonary:     Effort: Pulmonary effort is normal. No respiratory distress.     Breath sounds: Normal breath sounds. No wheezing or rales.  Abdominal:     General: Bowel sounds are normal. There is no distension.     Palpations: Abdomen is soft.     Tenderness: There is no abdominal tenderness. There is no rebound.   Musculoskeletal:     Cervical back: Normal range of motion.  Skin:    General: Skin is warm and dry.  Neurological:     Mental Status: She is alert and oriented to person, place, and time.     Coordination: Coordination normal.     Vitals:   11/07/22 1039  BP: 120/80  Pulse: (!) 54  Temp: 97.8 F (36.6 C)  TempSrc: Oral  SpO2: 98%  Weight: 166 lb (75.3 kg)  Height: 5' 3"$  (1.6 m)    Assessment & Plan:

## 2022-11-07 NOTE — Assessment & Plan Note (Signed)
Due for DEXA later this year.

## 2022-11-07 NOTE — Assessment & Plan Note (Signed)
BP at goal checking CBC and CMP and lipid panel. Adjust amlodipine/benazepril 10/20 and hctz 25 mg daily as needed.

## 2022-11-07 NOTE — Assessment & Plan Note (Signed)
Flu shot declines. Pneumonia complete. Shingrix counseled due at pharmacy. Tetanus due 2025. Colonoscopy due 2025. Mammogram due 2025, pap smear aged out and dexa due 2024. Counseled about sun safety and mole surveillance. Counseled about the dangers of distracted driving. Given 10 year screening recommendations.

## 2022-11-07 NOTE — Patient Instructions (Signed)
We are checking the labs today.  

## 2022-11-07 NOTE — Assessment & Plan Note (Signed)
Checking lipid panel and adjust simvastatin 20 mg daily as needed.  

## 2022-11-07 NOTE — Assessment & Plan Note (Signed)
Checking HgA1c and adjust as needed.  

## 2023-01-22 ENCOUNTER — Ambulatory Visit (HOSPITAL_COMMUNITY)
Admission: RE | Admit: 2023-01-22 | Discharge: 2023-01-22 | Disposition: A | Discharge status: Home / Self Care | Payer: Medicare PPO | Source: Ambulatory Visit | Attending: Urology | Admitting: Urology

## 2023-01-22 ENCOUNTER — Other Ambulatory Visit (HOSPITAL_COMMUNITY): Payer: Self-pay | Admitting: Urology

## 2023-01-22 DIAGNOSIS — C649 Malignant neoplasm of unspecified kidney, except renal pelvis: Secondary | ICD-10-CM | POA: Diagnosis not present

## 2023-01-22 DIAGNOSIS — Z85528 Personal history of other malignant neoplasm of kidney: Secondary | ICD-10-CM

## 2023-01-24 DIAGNOSIS — C642 Malignant neoplasm of left kidney, except renal pelvis: Secondary | ICD-10-CM | POA: Diagnosis not present

## 2023-01-24 DIAGNOSIS — C649 Malignant neoplasm of unspecified kidney, except renal pelvis: Secondary | ICD-10-CM | POA: Diagnosis not present

## 2023-01-24 DIAGNOSIS — N281 Cyst of kidney, acquired: Secondary | ICD-10-CM | POA: Diagnosis not present

## 2023-01-29 DIAGNOSIS — C642 Malignant neoplasm of left kidney, except renal pelvis: Secondary | ICD-10-CM | POA: Diagnosis not present

## 2023-02-12 ENCOUNTER — Other Ambulatory Visit: Payer: Self-pay | Admitting: Internal Medicine

## 2023-02-12 DIAGNOSIS — Z1231 Encounter for screening mammogram for malignant neoplasm of breast: Secondary | ICD-10-CM

## 2023-02-15 DIAGNOSIS — H52223 Regular astigmatism, bilateral: Secondary | ICD-10-CM | POA: Diagnosis not present

## 2023-02-15 DIAGNOSIS — H5202 Hypermetropia, left eye: Secondary | ICD-10-CM | POA: Diagnosis not present

## 2023-02-15 DIAGNOSIS — H43813 Vitreous degeneration, bilateral: Secondary | ICD-10-CM | POA: Diagnosis not present

## 2023-02-15 DIAGNOSIS — H524 Presbyopia: Secondary | ICD-10-CM | POA: Diagnosis not present

## 2023-02-15 DIAGNOSIS — H04123 Dry eye syndrome of bilateral lacrimal glands: Secondary | ICD-10-CM | POA: Diagnosis not present

## 2023-02-15 DIAGNOSIS — H25813 Combined forms of age-related cataract, bilateral: Secondary | ICD-10-CM | POA: Diagnosis not present

## 2023-02-15 DIAGNOSIS — H35372 Puckering of macula, left eye: Secondary | ICD-10-CM | POA: Diagnosis not present

## 2023-04-02 ENCOUNTER — Ambulatory Visit
Admission: RE | Admit: 2023-04-02 | Discharge: 2023-04-02 | Disposition: A | Discharge status: Home / Self Care | Payer: Medicare PPO | Source: Ambulatory Visit | Attending: Internal Medicine | Admitting: Internal Medicine

## 2023-04-02 DIAGNOSIS — Z1231 Encounter for screening mammogram for malignant neoplasm of breast: Secondary | ICD-10-CM | POA: Diagnosis not present

## 2023-04-03 DIAGNOSIS — H04123 Dry eye syndrome of bilateral lacrimal glands: Secondary | ICD-10-CM | POA: Diagnosis not present

## 2023-10-26 ENCOUNTER — Ambulatory Visit (INDEPENDENT_AMBULATORY_CARE_PROVIDER_SITE_OTHER): Payer: Medicare PPO

## 2023-10-26 DIAGNOSIS — Z0000 Encounter for general adult medical examination without abnormal findings: Secondary | ICD-10-CM

## 2023-10-26 NOTE — Patient Instructions (Signed)
Amy Haynes , Thank you for taking time to come for your Medicare Wellness Visit. I appreciate your ongoing commitment to your health goals. Please review the following plan we discussed and let me know if I can assist you in the future.   Referrals/Orders/Follow-Ups/Clinician Recommendations: none  This is a list of the screening recommended for you and due dates:  Health Maintenance  Topic Date Due   Zoster (Shingles) Vaccine (1 of 2) 01/29/2002   COVID-19 Vaccine (4 - 2024-25 season) 05/27/2023   DTaP/Tdap/Td vaccine (2 - Td or Tdap) 02/10/2024   Mammogram  04/01/2024   Colon Cancer Screening  09/15/2024   Medicare Annual Wellness Visit  10/25/2024   Pneumonia Vaccine  Completed   Flu Shot  Completed   DEXA scan (bone density measurement)  Completed   Hepatitis C Screening  Completed   HPV Vaccine  Aged Out    Advanced directives: (Copy Requested) Please bring a copy of your health care power of attorney and living will to the office to be added to your chart at your convenience.  Next Medicare Annual Wellness Visit scheduled for next year: No, declined at this time  insert Preventive Care attachment Insert FALL PREVENTION attachment if needed

## 2023-10-26 NOTE — Progress Notes (Signed)
Subjective:   Amy Haynes is a 73 y.o. female who presents for Medicare Annual (Subsequent) preventive examination.  Visit Complete: Virtual I connected with  [name] on 10/26/23 by a audio enabled telemedicine application and verified that I am speaking with the correct person using two identifiers.  This patient declined Interactive audio and Acupuncturist. Therefore the visit was completed with audio only.  Patient Location: Home  Provider Location: Home Office  I discussed the limitations of evaluation and management by telemedicine. The patient expressed understanding and agreed to proceed.  Vital Signs: Because this visit was a virtual/telehealth visit, some criteria may be missing or patient reported. Any vitals not documented were not able to be obtained and vitals that have been documented are patient reported.  Patient Medicare AWV questionnaire was completed by the patient on 10/24/2023; I have confirmed that all information answered by patient is correct and no changes since this date.  Cardiac Risk Factors include: advanced age (>66men, >47 women);hypertension     Objective:    Today's Vitals   There is no height or weight on file to calculate BMI.     10/26/2023    3:57 PM 09/13/2022    3:56 PM 09/13/2022    6:48 AM 09/07/2022    2:20 PM 10/21/2021    9:07 AM 03/23/2019    1:16 PM 03/30/2017   10:04 AM  Advanced Directives  Does Patient Have a Medical Advance Directive? Yes Yes Yes Yes Yes Yes No  Type of Estate agent of [place];Living will Healthcare Power of [employer] of [place];Living will Healthcare Power of [place];Living will Healthcare Power of [place];Living will    Does patient want to make changes to medical advance directive?  No - Patient declined       Copy of Healthcare Power of Attorney in Chart? No - copy requested    No - copy requested    Would patient like information on creating a  medical advance directive?       Yes (ED - Information included in AVS)    Current Medications (verified) Outpatient Encounter Medications as of 10/26/2023  Medication Sig   amLODipine-benazepril (LOTREL) 10-20 MG capsule Take 1 capsule by mouth daily.   calcium elemental as carbonate (TUMS ULTRA 1000) 400 MG chewable tablet Chew 1,000 mg by mouth daily as needed for heartburn.   lactase (LACTAID) 3000 units tablet Take 3,000 Units by mouth daily as needed (eating milk products).   OVER THE COUNTER MEDICATION Apply 1 Application topically daily as needed (pain). Arthritis cream   simethicone (MYLICON) 125 MG chewable tablet Chew 125 mg by mouth every 6 (six) hours as needed for flatulence.   simvastatin (ZOCOR) 20 MG tablet Take 1 tablet (20 mg total) by mouth daily at 6 PM.   SYSTANE ULTRA 0.4-0.3 % SOLN Place 1 drop into both eyes daily as needed (dry eyes).   triamterene-hydrochlorothiazide (MAXZIDE-25) 37.5-25 MG tablet Take 1 tablet by mouth daily.   No facility-administered encounter medications on file as of 10/26/2023.    Allergies (verified) Lactose intolerance (gi) and Tramadol   History: Past Medical History:  Diagnosis Date   Anxiety    Arthritis    knees   Diverticulosis of colon    History of bronchitis    Hypercholesteremia    Hypertension    Microcytosis    Osteoporosis    Triggering of finger    Rt 3rd finger   Past Surgical History:  Procedure Laterality Date   CESAREAN SECTION     x2   COLONOSCOPY  2020   with polypectomy   CYST EXCISION Left 2022   face   FOOT SURGERY Left 1991   MULTIPLE TOOTH EXTRACTIONS     ROBOTIC ASSITED PARTIAL NEPHRECTOMY Left 09/13/2022   Procedure: XI ROBOTIC ASSISTED PARTIAL NEPHRECTOMY;  Surgeon: Rene Paci, MD;  Location: [facility];  Service: Urology;  Laterality: Left;  4 HRS   Family History  Problem Relation Age of Onset   Arthritis Mother    Hypertension Mother    Cancer Mother 72       breast    Breast cancer Mother    Kidney disease Sister    Diabetes Sister    Heart disease Sister    Colon cancer Brother    Colon polyps Neg Hx    Esophageal cancer Neg Hx    Rectal cancer Neg Hx    Stomach cancer Neg Hx    Social History   Socioeconomic History   Marital status: Single    Spouse name: Not on file   Number of children: 2   Years of education: Not on file   Highest education level: Not on file  Occupational History   Not on file  Tobacco Use   Smoking status: Never   Smokeless tobacco: Never  Vaping Use   Vaping status: Never Used  Substance and Sexual Activity   Alcohol use: Yes    Comment: social use   Drug use: No   Sexual activity: Not Currently  Other Topics Concern   Not on file  Social History Narrative   Not on file   Social Drivers of Health   Financial Resource Strain: Low Risk  (10/24/2023)   Overall Financial Resource Strain (CARDIA)    Difficulty of Paying Living Expenses: Not hard at all  Food Insecurity: No Food Insecurity (10/24/2023)   Hunger Vital Sign    Worried About Running Out of Food in the Last Year: Never true    Ran Out of Food in the Last Year: Never true  Transportation Needs: No Transportation Needs (10/24/2023)   PRAPARE - Administrator, Civil Service (Medical): No    Lack of Transportation (Non-Medical): No  Physical Activity: Sufficiently Active (10/24/2023)   Exercise Vital Sign    Days of Exercise per Week: 5 days    Minutes of Exercise per Session: 90 min  Stress: Stress Concern Present (10/24/2023)   [employer] of Occupational Health - Occupational Stress Questionnaire    Feeling of Stress : To some extent  Social Connections: Unknown (10/24/2023)   Social Connection and Isolation Panel [NHANES]    Frequency of Communication with Friends and Family: More than three times a week    Frequency of Social Gatherings with Friends and Family: More than three times a week    Attends Religious Services: Patient  declined    Database administrator or Organizations: Patient declined    Attends Engineer, structural: Not on file    Marital Status: Divorced    Tobacco Counseling Counseling given: Not Answered   Clinical Intake:  Pre-visit preparation completed: Yes  Pain : No/denies pain     Nutritional Risks: None Diabetes: No  How often do you need to have someone help you when you read instructions, pamphlets, or other written materials from your doctor or pharmacy?: 1 - Never  Interpreter Needed?: No  Information entered by :: NAllen LPN  Activities of Daily Living    10/24/2023    2:31 PM 11/03/2022    8:54 AM  In your present state of health, do you have any difficulty performing the following activities:  Hearing? 0 0  Vision? 0 0  Difficulty concentrating or making decisions? 0 0  Walking or climbing stairs? 0 0  Dressing or bathing? 0 0  Doing errands, shopping? 0 0  Preparing Food and eating ? N N  Using the Toilet? N N  In the past six months, have you accidently leaked urine? N N  Do you have problems with loss of bowel control? N N  Managing your Medications? N N  Managing your Finances? N N  Housekeeping or managing your Housekeeping? N N    Patient Care Team: Myrlene Broker, MD as PCP - General (Internal Medicine)  Indicate any recent Medical Services you may have received from other than [facility] providers in the past year (date may be approximate).     Assessment:   This is a routine wellness examination for Jetaun.  Hearing/Vision screen Hearing Screening - Comments:: Denies hearing issues Vision Screening - Comments:: Regular eye exams, [facility]   Goals Addressed             This Visit's Progress    Patient Stated       10/26/2023, wants to come off blood pressure medicine       Depression Screen    10/26/2023    3:58 PM 11/07/2022   10:44 AM 05/09/2022    8:20 AM 10/21/2021    9:08 AM 10/21/2021    9:06 AM  07/21/2020   11:13 AM 07/25/2019    9:33 AM  PHQ 2/9 Scores  PHQ - 2 Score 0 0 0 0 0 0 1  PHQ- 9 Score 0 0 0        Fall Risk    10/24/2023    2:31 PM 11/07/2022   10:43 AM 11/03/2022    8:54 AM 10/19/2022    9:38 AM 05/09/2022    8:20 AM  Fall Risk   Falls in the past year? 0 0 0 0 0  Number falls in past yr: 0 0 0  0  Injury with Fall? 0 0 0  0  Risk for fall due to : Medication side effect    No Fall Risks  Follow up Falls prevention discussed;Falls evaluation completed Falls evaluation completed   Falls evaluation completed    MEDICARE RISK AT HOME: Medicare Risk at Home Any stairs in or around the home?: (Patient-Rptd) No If so, are there any without handrails?: (Patient-Rptd) No Home free of loose throw rugs in walkways, pet beds, electrical cords, etc?: (Patient-Rptd) Yes Adequate lighting in your home to reduce risk of falls?: (Patient-Rptd) Yes Life alert?: (Patient-Rptd) No Use of a cane, walker or w/c?: (Patient-Rptd) No Grab bars in the bathroom?: (Patient-Rptd) No Shower chair or bench in shower?: (Patient-Rptd) No Elevated toilet seat or a handicapped toilet?: (Patient-Rptd) Yes  TIMED UP AND GO:  Was the test performed?  No    Cognitive Function:    03/30/2017    9:39 AM  MMSE - Mini Mental State Exam  Orientation to time 5  Orientation to Place 5  Registration 3  Attention/ Calculation 5  Recall 3  Language- name 2 objects 2  Language- repeat 1  Language- follow 3 step command 3  Language- read & follow direction 1  Write a sentence 1  Copy design 1  Total score 30        10/26/2023    4:00 PM  6CIT Screen  What Year? 0 points  What month? 0 points  What time? 0 points  Count back from 20 0 points  Months in reverse 0 points  Repeat phrase 0 points  Total Score 0 points    Immunizations Immunization History  Administered Date(s) Administered   Fluad Quad(high Dose 65+) 07/25/2019, 07/21/2020   Influenza Split 07/01/2012, 07/12/2013    Influenza, High Dose Seasonal PF 07/18/2018   Influenza,inj,Quad PF,6+ Mos 07/17/2017   Influenza-Unspecified 05/26/1988, 06/25/2014, 07/29/2016, 09/05/2023   PFIZER(Purple Top)SARS-COV-2 Vaccination 11/01/2019, 11/22/2019, 06/24/2020   PPD Test 06/09/2015   Pneumococcal Conjugate-13 07/17/2017   Pneumococcal Polysaccharide-23 07/18/2018   Tdap 02/09/2014   Zoster, Live 03/23/2016    TDAP status: Up to date  Flu Vaccine status: Up to date  Pneumococcal vaccine status: Up to date  Covid-19 vaccine status: Information provided on how to obtain vaccines.   Qualifies for Shingles Vaccine? Yes   Zostavax completed Yes   Shingrix Completed?: No.    Education has been provided regarding the importance of this vaccine. Patient has been advised to call insurance company to determine out of pocket expense if they have not yet received this vaccine. Advised may also receive vaccine at local pharmacy or Health Dept. Verbalized acceptance and understanding.  Screening Tests Health Maintenance  Topic Date Due   Zoster Vaccines- Shingrix (1 of 2) 01/29/2002   COVID-19 Vaccine (4 - 2024-25 season) 05/27/2023   DTaP/Tdap/Td (2 - Td or Tdap) 02/10/2024   MAMMOGRAM  04/01/2024   Colonoscopy  09/15/2024   Medicare Annual Wellness (AWV)  10/25/2024   Pneumonia Vaccine 31+ Years old  Completed   INFLUENZA VACCINE  Completed   DEXA SCAN  Completed   Hepatitis C Screening  Completed   HPV VACCINES  Aged Out    Health Maintenance  Health Maintenance Due  Topic Date Due   Zoster Vaccines- Shingrix (1 of 2) 01/29/2002   COVID-19 Vaccine (4 - 2024-25 season) 05/27/2023    Colorectal cancer screening: Type of screening: Colonoscopy. Completed 09/16/2019. Repeat every 5 years  Mammogram status: Completed 04/02/2023. Repeat every year  Bone Density status: Completed 08/07/2022.   Lung Cancer Screening: (Low Dose CT Chest recommended if Age 31-80 years, 20 pack-year currently smoking OR have quit  w/in 15years.) does not qualify.   Lung Cancer Screening Referral: no  Additional Screening:  Hepatitis C Screening: does qualify; Completed 03/23/2016  Vision Screening: Recommended annual ophthalmology exams for early detection of glaucoma and other disorders of the eye. Is the patient up to date with their annual eye exam?  Yes  Who is the provider or what is the name of the office in which the patient attends annual eye exams? [facility] If pt is not established with a provider, would they like to be referred to a provider to establish care? No .   Dental Screening: Recommended annual dental exams for proper oral hygiene  Diabetic Foot Exam: n/a  Community Resource Referral / Chronic Care Management: CRR required this visit?  No   CCM required this visit?  No     Plan:     I have personally reviewed and noted the following in the patient's chart:   Medical and social history Use of alcohol, tobacco or illicit drugs  Current medications and supplements including opioid prescriptions. Patient is not currently taking opioid prescriptions. Functional  ability and status Nutritional status Physical activity Advanced directives List of other physicians Hospitalizations, surgeries, and ER visits in previous 12 months Vitals Screenings to include cognitive, depression, and falls Referrals and appointments  In addition, I have reviewed and discussed with patient certain preventive protocols, quality metrics, and best practice recommendations. A written personalized care plan for preventive services as well as general preventive health recommendations were provided to patient.     Barb Merino, LPN   1/61/0960   After Visit Summary: (MyChart) Due to this being a telephonic visit, the after visit summary with patients personalized plan was offered to patient via MyChart   Nurse Notes: none

## 2023-10-28 ENCOUNTER — Encounter: Payer: Self-pay | Admitting: Internal Medicine

## 2023-10-28 DIAGNOSIS — R7303 Prediabetes: Secondary | ICD-10-CM

## 2023-10-28 DIAGNOSIS — I10 Essential (primary) hypertension: Secondary | ICD-10-CM

## 2023-10-30 ENCOUNTER — Other Ambulatory Visit (INDEPENDENT_AMBULATORY_CARE_PROVIDER_SITE_OTHER): Payer: Medicare PPO

## 2023-10-30 DIAGNOSIS — I10 Essential (primary) hypertension: Secondary | ICD-10-CM

## 2023-10-30 DIAGNOSIS — R7303 Prediabetes: Secondary | ICD-10-CM | POA: Diagnosis not present

## 2023-10-30 LAB — LIPID PANEL
Cholesterol: 175 mg/dL (ref 0–200)
HDL: 77.6 mg/dL (ref >39.00)
LDL Cholesterol: 87 mg/dL (ref 0–99)
NonHDL: 97.61
Total CHOL/HDL Ratio: 2
Triglycerides: 53 mg/dL (ref 0.0–149.0)
VLDL: 10.6 mg/dL (ref 0.0–40.0)

## 2023-10-30 LAB — COMPREHENSIVE METABOLIC PANEL
ALT: 17 U/L (ref 0–35)
AST: 22 U/L (ref 0–37)
Albumin: 4.4 g/dL (ref 3.5–5.2)
Alkaline Phosphatase: 70 U/L (ref 39–117)
BUN: 15 mg/dL (ref 6–23)
CO2: 30 meq/L (ref 19–32)
Calcium: 9.6 mg/dL (ref 8.4–10.5)
Chloride: 101 meq/L (ref 96–112)
Creatinine, Ser: 0.73 mg/dL (ref 0.40–1.20)
GFR: 82.55 mL/min (ref >60.00)
Glucose, Bld: 85 mg/dL (ref 70–99)
Potassium: 3.7 meq/L (ref 3.5–5.1)
Sodium: 140 meq/L (ref 135–145)
Total Bilirubin: 0.7 mg/dL (ref 0.2–1.2)
Total Protein: 8.1 g/dL (ref 6.0–8.3)

## 2023-10-30 LAB — CBC
HCT: 41.3 % (ref 36.0–46.0)
Hemoglobin: 13.4 g/dL (ref 12.0–15.0)
MCHC: 32.4 g/dL (ref 30.0–36.0)
MCV: 82.9 fL (ref 78.0–100.0)
Platelets: 177 10*3/uL (ref 150.0–400.0)
RBC: 4.99 Mil/uL (ref 3.87–5.11)
RDW: 14.7 % (ref 11.5–15.5)
WBC: 3 10*3/uL — ABNORMAL LOW (ref 4.0–10.5)

## 2023-10-30 LAB — HEMOGLOBIN A1C: Hgb A1c MFr Bld: 5.7 % (ref 4.6–6.5)

## 2023-10-31 ENCOUNTER — Encounter: Payer: Self-pay | Admitting: Internal Medicine

## 2023-11-09 ENCOUNTER — Other Ambulatory Visit: Payer: Self-pay | Admitting: Internal Medicine

## 2023-11-09 DIAGNOSIS — I10 Essential (primary) hypertension: Secondary | ICD-10-CM

## 2023-11-13 ENCOUNTER — Encounter: Payer: Self-pay | Admitting: Internal Medicine

## 2023-11-13 ENCOUNTER — Ambulatory Visit (INDEPENDENT_AMBULATORY_CARE_PROVIDER_SITE_OTHER): Payer: Medicare PPO | Admitting: Internal Medicine

## 2023-11-13 ENCOUNTER — Encounter (INDEPENDENT_AMBULATORY_CARE_PROVIDER_SITE_OTHER): Payer: Self-pay

## 2023-11-13 VITALS — BP 126/84 | HR 56 | Temp 98.1°F | Ht 63.0 in | Wt 160.0 lb

## 2023-11-13 DIAGNOSIS — I10 Essential (primary) hypertension: Secondary | ICD-10-CM

## 2023-11-13 DIAGNOSIS — Z0000 Encounter for general adult medical examination without abnormal findings: Secondary | ICD-10-CM

## 2023-11-13 DIAGNOSIS — R002 Palpitations: Secondary | ICD-10-CM

## 2023-11-13 DIAGNOSIS — R202 Paresthesia of skin: Secondary | ICD-10-CM | POA: Diagnosis not present

## 2023-11-13 DIAGNOSIS — E7800 Pure hypercholesterolemia, unspecified: Secondary | ICD-10-CM | POA: Diagnosis not present

## 2023-11-13 DIAGNOSIS — R911 Solitary pulmonary nodule: Secondary | ICD-10-CM | POA: Diagnosis not present

## 2023-11-13 DIAGNOSIS — R7303 Prediabetes: Secondary | ICD-10-CM | POA: Diagnosis not present

## 2023-11-13 DIAGNOSIS — R200 Anesthesia of skin: Secondary | ICD-10-CM | POA: Diagnosis not present

## 2023-11-13 LAB — TSH: TSH: 0.75 u[IU]/mL (ref 0.35–5.50)

## 2023-11-13 LAB — VITAMIN B12: Vitamin B-12: 1506 pg/mL — ABNORMAL HIGH (ref 211–911)

## 2023-11-13 LAB — VITAMIN D 25 HYDROXY (VIT D DEFICIENCY, FRACTURES): VITD: 43.24 ng/mL (ref 30.00–100.00)

## 2023-11-13 MED ORDER — SIMVASTATIN 20 MG PO TABS: 20.0000 mg | ORAL_TABLET | Freq: Every day | ORAL | 3 refills | Status: AC

## 2023-11-13 MED ORDER — TRIAMTERENE-HCTZ 37.5-25 MG PO TABS
1.0000 | ORAL_TABLET | Freq: Every day | ORAL | 3 refills | Status: AC
Start: 2023-11-13 — End: ?

## 2023-11-13 MED ORDER — AMLODIPINE BESY-BENAZEPRIL HCL 10-20 MG PO CAPS: 1.0000 | ORAL_CAPSULE | Freq: Every day | ORAL | 3 refills | Status: AC

## 2023-11-13 NOTE — Progress Notes (Unsigned)
   Subjective:   Patient ID: Amy Haynes, female    DOB: Sep 03, 1951, 73 y.o.   MRN: [id number]  HPI The patient is here for physical and also new concerns.  PMH, [facility], social history reviewed and updated  Review of Systems  Objective:  Physical Exam  Vitals:   11/13/23 1052  BP: 126/84  Pulse: 86  Temp: 98.1 F (36.7 C)  TempSrc: Oral  SpO2: 96%  Weight: 160 lb (72.6 kg)  Height: 5\' 3"  (1.6 m)   EKG: Rate 49, axis normal, interval normal, sinus with 1st degree block, no st or t wave changes, no significant change compared to prior 2023  Assessment & Plan:

## 2023-11-13 NOTE — Patient Instructions (Signed)
 We will get the CT scan of the lungs and check some labs today.   The EKG we did was normal

## 2023-11-14 ENCOUNTER — Encounter: Payer: Self-pay | Admitting: Internal Medicine

## 2023-11-14 DIAGNOSIS — R200 Anesthesia of skin: Secondary | ICD-10-CM | POA: Insufficient documentation

## 2023-11-14 DIAGNOSIS — R002 Palpitations: Secondary | ICD-10-CM | POA: Insufficient documentation

## 2023-11-14 NOTE — Assessment & Plan Note (Signed)
 Due to miscommunication she did not get CT lung with her CT abdomen and pelvis May 2024. Ordering this to be done now as she is overdue for follow up.

## 2023-11-14 NOTE — Assessment & Plan Note (Signed)
 New problem and in hands. Checking TSH and vitamin D and B12. Suspect carpal tunnel and if labs normal will advise bracing at night time to help.

## 2023-11-14 NOTE — Assessment & Plan Note (Signed)
 REviewed HgA1c results with her.

## 2023-11-14 NOTE — Assessment & Plan Note (Signed)
 With new palpitations and fairly regular. EKG done and no changes. Checking TSH and vitamin D and B12. If normal will order holter montioring to assess etiology.

## 2023-11-14 NOTE — Assessment & Plan Note (Signed)
 Reviewed lipid panel results with her and continue simvastatin.

## 2023-11-14 NOTE — Assessment & Plan Note (Signed)
 Flu shot up to date. Pneumonia complete. Shingrix due at pharmacy. Tetanus up to date. Colonoscopy up to date. Mammogram up to date, pap smear aged out and dexa up to date. Counseled about sun safety and mole surveillance. Counseled about the dangers of distracted driving. Given 10 year screening recommendations.

## 2023-11-14 NOTE — Assessment & Plan Note (Signed)
 BP at goal continue hydrochlorothiazide, amlodipine and benazepril. Reviewed labs with her.

## 2023-11-22 ENCOUNTER — Telehealth: Payer: Self-pay

## 2023-11-22 NOTE — Progress Notes (Signed)
 Care Guide Pharmacy Note  11/22/2023 Name: MONTEEN TOOPS MRN: [id number] DOB: 07-Dec-1950  Referred By: Myrlene Broker, MD Reason for referral: Care Coordination (Outreach to schedule with Pharm d )   ROSEZETTA BALDERSTON is a 73 y.o. year old female who is a primary care patient of Myrlene Broker, MD.  Narda Neilani Duffee was referred to the pharmacist for assistance related to: HTN  Successful contact was made with the patient to discuss pharmacy services including being ready for the pharmacist to call at least 5 minutes before the scheduled appointment time and to have medication bottles and any blood pressure readings ready for review. The patient agreed to meet with the pharmacist via telephone visit on (date/time).12/11/2023  Penne Lash , RMA     [place]  [facility], [facility] Guide  Direct Dial: [phone]  Website: [email or web address]

## 2023-11-22 NOTE — Progress Notes (Signed)
 Care Guide Pharmacy Note  11/22/2023 Name: Amy Haynes MRN: [id number] DOB: 1951/01/03  Referred By: Myrlene Broker, MD Reason for referral: Care Coordination (Outreach to schedule with Pharm d )   Amy Haynes is a 73 y.o. year old female who is a primary care patient of Myrlene Broker, MD.  Amy Haynes was referred to the pharmacist for assistance related to: HTN  An unsuccessful telephone outreach was attempted today to contact the patient who was referred to the pharmacy team for assistance with medication management. Additional attempts will be made to contact the patient.  Penne Lash , RMA     [facility]  [facility], [facility] Guide  Direct Dial: [phone]  Website: [email or web address]

## 2023-11-26 ENCOUNTER — Encounter: Payer: Self-pay | Admitting: Internal Medicine

## 2023-11-26 DIAGNOSIS — M810 Age-related osteoporosis without current pathological fracture: Secondary | ICD-10-CM

## 2023-11-29 NOTE — Telephone Encounter (Signed)
 Pt is ok with bone density order

## 2023-12-04 ENCOUNTER — Ambulatory Visit
Admission: RE | Admit: 2023-12-04 | Discharge: 2023-12-04 | Disposition: A | Discharge status: Home / Self Care | Payer: Medicare PPO | Source: Ambulatory Visit | Attending: Internal Medicine | Admitting: Internal Medicine

## 2023-12-04 DIAGNOSIS — R911 Solitary pulmonary nodule: Secondary | ICD-10-CM

## 2023-12-04 DIAGNOSIS — J479 Bronchiectasis, uncomplicated: Secondary | ICD-10-CM | POA: Diagnosis not present

## 2023-12-04 DIAGNOSIS — I700 Atherosclerosis of aorta: Secondary | ICD-10-CM | POA: Diagnosis not present

## 2023-12-05 ENCOUNTER — Encounter: Payer: Self-pay | Admitting: Internal Medicine

## 2023-12-11 ENCOUNTER — Other Ambulatory Visit: Payer: Medicare PPO

## 2023-12-27 ENCOUNTER — Other Ambulatory Visit: Admitting: Pharmacist

## 2023-12-27 DIAGNOSIS — I10 Essential (primary) hypertension: Secondary | ICD-10-CM

## 2023-12-27 NOTE — Progress Notes (Signed)
 12/27/2023 Name: Amy Haynes MRN: [id number] DOB: [birth year]  Chief Complaint  Patient presents with   Medication Review    Amy Haynes is a 73 y.o. year old female who presented for a telephone visit.   They were referred to the pharmacist for assistance with medication review .    Subjective:  Care Team: Primary Care Provider: Myrlene Broker, MD ; Next Scheduled Visit: none scheduled  Medication Access/Adherence  Current Pharmacy:  [facility] #[id number] G[place] Otto,  - [address] AT [facility] [address] [place] [address] Phone: [phone] Fax: [phone]   Reviewed medication list with pt. She notes she has been on these medications for years with no change. She reports symptoms that she feels may be caused by medication side effects. She report Bruising, itching, tingling in fingers and feet, heart palpitations, low HR (48-52).   She has been monitoring her BP at home. BP readings: BP yesterday 125/86, 128/84, 3/30 109/81, 119/79, 124/85, highest 137/89, lowest 106/75  She denies use of any OTC medications for pain  Objective:  Lab Results  Component Value Date   HGBA1C 5.7 10/30/2023    Lab Results  Component Value Date   CREATININE 0.73 10/30/2023   BUN 15 10/30/2023   NA 140 10/30/2023   K 3.7 10/30/2023   CL 101 10/30/2023   CO2 30 10/30/2023    Lab Results  Component Value Date   CHOL 175 10/30/2023   HDL 77.60 10/30/2023   LDLCALC 87 10/30/2023   LDLDIRECT 127.4 12/02/2010   TRIG 53.0 10/30/2023   CHOLHDL 2 10/30/2023    Medications Reviewed Today     Reviewed by Bonita Quin, RPH (Pharmacist) on 12/27/23 at 1521  Med List Status: <None>   Medication Order Taking? Sig Documenting Provider Last Dose Status Informant  amLODipine-benazepril (LOTREL) 10-20 MG capsule [id number] Yes Take 1 capsule by mouth daily. Myrlene Broker, MD Taking Active   calcium elemental as carbonate (TUMS ULTRA  1000) 400 MG chewable tablet [id number] Yes Chew 1,000 mg by mouth daily as needed for heartburn. [provider] Taking Active Self  Coenzyme Q10 (COQ10) 100 MG CAPS [id number] Yes  [provider] Taking Active   lactase (LACTAID) 3000 units tablet [id number]  Take 3,000 Units by mouth daily as needed (eating milk products). [provider]  Active Self  Multiple Vitamins-Minerals (MULTIVITAMIN ADULTS 50+) TABS [id number] Yes  [provider] Taking Active   Omega-3 Fatty Acids (FISH OIL) 1000 MG CAPS [id number] Yes Take 1 capsule by mouth daily. [provider] Taking Active Self  OVER THE COUNTER MEDICATION [id number]  Apply 1 Application topically daily as needed (pain). Arthritis cream [provider]  Active Self  simethicone (MYLICON) 125 MG chewable tablet [id number]  Chew 125 mg by mouth every 6 (six) hours as needed for flatulence. [provider]  Active Self  simvastatin (ZOCOR) 20 MG tablet [id number] Yes Take 1 tablet (20 mg total) by mouth daily at 6 PM. Myrlene Broker, MD Taking Active   SYSTANE ULTRA 0.4-0.3 % SOLN [id number] Yes Place 1 drop into both eyes daily as needed (dry eyes). [provider] Taking Active Self  triamterene-hydrochlorothiazide (MAXZIDE-25) 37.5-25 MG tablet [id number] Yes Take 1 tablet by mouth daily. Myrlene Broker, MD Taking Active               Assessment/Plan:   Medication review: -Symptoms she reports are not common side effects for the medications  she is on however, only way to rule it out would be to stop medications to monitor symptoms. Explained that her BP is very well controlled therefore holding medications would likely cause her BP to become uncontrolled. - Did note that PCP recommended holter monitor if EKG and labs were normal. Will send message regarding this.  Follow Up Plan: PRN  Arbutus Leas, PharmD, BCPS, CPP Clinical Pharmacist Practitioner [place]  [facility] at [facility] [phone]

## 2023-12-27 NOTE — Patient Instructions (Signed)
 It was a pleasure speaking with you today!  Continue your current regimen and monitoring blood pressure/heart rate.  I will speak to Dr. Okey Dupre about the holter monitor.  Feel free to call with any questions or concerns!  Arbutus Leas, PharmD, BCPS, CPP Clinical Pharmacist Practitioner [place] [facility] [phone]

## 2024-01-17 DIAGNOSIS — C642 Malignant neoplasm of left kidney, except renal pelvis: Secondary | ICD-10-CM | POA: Diagnosis not present

## 2024-01-23 DIAGNOSIS — D49512 Neoplasm of unspecified behavior of left kidney: Secondary | ICD-10-CM | POA: Diagnosis not present

## 2024-01-23 DIAGNOSIS — Z905 Acquired absence of kidney: Secondary | ICD-10-CM | POA: Diagnosis not present

## 2024-01-23 DIAGNOSIS — K449 Diaphragmatic hernia without obstruction or gangrene: Secondary | ICD-10-CM | POA: Diagnosis not present

## 2024-01-23 DIAGNOSIS — N281 Cyst of kidney, acquired: Secondary | ICD-10-CM | POA: Diagnosis not present

## 2024-02-07 DIAGNOSIS — C642 Malignant neoplasm of left kidney, except renal pelvis: Secondary | ICD-10-CM | POA: Diagnosis not present

## 2024-02-19 ENCOUNTER — Other Ambulatory Visit: Payer: Self-pay | Admitting: Internal Medicine

## 2024-02-19 DIAGNOSIS — Z1231 Encounter for screening mammogram for malignant neoplasm of breast: Secondary | ICD-10-CM

## 2024-02-21 DIAGNOSIS — H52223 Regular astigmatism, bilateral: Secondary | ICD-10-CM | POA: Diagnosis not present

## 2024-02-21 DIAGNOSIS — H43813 Vitreous degeneration, bilateral: Secondary | ICD-10-CM | POA: Diagnosis not present

## 2024-02-21 DIAGNOSIS — H25813 Combined forms of age-related cataract, bilateral: Secondary | ICD-10-CM | POA: Diagnosis not present

## 2024-02-21 DIAGNOSIS — H35372 Puckering of macula, left eye: Secondary | ICD-10-CM | POA: Diagnosis not present

## 2024-02-21 DIAGNOSIS — H5202 Hypermetropia, left eye: Secondary | ICD-10-CM | POA: Diagnosis not present

## 2024-02-21 DIAGNOSIS — H04123 Dry eye syndrome of bilateral lacrimal glands: Secondary | ICD-10-CM | POA: Diagnosis not present

## 2024-02-21 DIAGNOSIS — H524 Presbyopia: Secondary | ICD-10-CM | POA: Diagnosis not present

## 2024-04-02 ENCOUNTER — Ambulatory Visit

## 2024-04-16 ENCOUNTER — Ambulatory Visit
Admission: RE | Admit: 2024-04-16 | Discharge: 2024-04-16 | Disposition: A | Discharge status: Home / Self Care | Source: Ambulatory Visit | Attending: Internal Medicine | Admitting: Internal Medicine

## 2024-04-16 DIAGNOSIS — Z1231 Encounter for screening mammogram for malignant neoplasm of breast: Secondary | ICD-10-CM | POA: Diagnosis not present

## 2024-04-21 ENCOUNTER — Ambulatory Visit: Payer: Self-pay | Admitting: Internal Medicine

## 2024-04-21 LAB — HM MAMMOGRAPHY

## 2024-04-25 NOTE — Progress Notes (Signed)
 Pt was able to review results there were no questions or concerns

## 2024-06-09 ENCOUNTER — Encounter: Payer: Self-pay | Admitting: Internal Medicine

## 2024-07-15 DIAGNOSIS — I700 Atherosclerosis of aorta: Secondary | ICD-10-CM | POA: Diagnosis not present

## 2024-07-15 DIAGNOSIS — R011 Cardiac murmur, unspecified: Secondary | ICD-10-CM | POA: Diagnosis not present

## 2024-07-15 DIAGNOSIS — J8410 Pulmonary fibrosis, unspecified: Secondary | ICD-10-CM | POA: Diagnosis not present

## 2024-07-15 DIAGNOSIS — M479 Spondylosis, unspecified: Secondary | ICD-10-CM | POA: Diagnosis not present

## 2024-07-15 DIAGNOSIS — E663 Overweight: Secondary | ICD-10-CM | POA: Diagnosis not present

## 2024-07-15 DIAGNOSIS — M159 Polyosteoarthritis, unspecified: Secondary | ICD-10-CM | POA: Diagnosis not present

## 2024-07-15 DIAGNOSIS — I10 Essential (primary) hypertension: Secondary | ICD-10-CM | POA: Diagnosis not present

## 2024-07-15 DIAGNOSIS — J479 Bronchiectasis, uncomplicated: Secondary | ICD-10-CM | POA: Diagnosis not present

## 2024-07-15 DIAGNOSIS — Z0000 Encounter for general adult medical examination without abnormal findings: Secondary | ICD-10-CM | POA: Diagnosis not present

## 2024-08-19 DIAGNOSIS — L720 Epidermal cyst: Secondary | ICD-10-CM | POA: Diagnosis not present

## 2024-08-19 DIAGNOSIS — D485 Neoplasm of uncertain behavior of skin: Secondary | ICD-10-CM | POA: Diagnosis not present

## 2024-08-26 DIAGNOSIS — Z79899 Other long term (current) drug therapy: Secondary | ICD-10-CM | POA: Diagnosis not present

## 2024-09-08 ENCOUNTER — Ambulatory Visit

## 2024-09-08 VITALS — Ht 63.0 in | Wt 163.0 lb

## 2024-09-08 DIAGNOSIS — Z860100 Personal history of colon polyps, unspecified: Secondary | ICD-10-CM

## 2024-09-08 MED ORDER — NA SULFATE-K SULFATE-MG SULF 17.5-3.13-1.6 GM/177ML PO SOLN: 1.0000 | Freq: Once | ORAL | 0 refills | Status: AC

## 2024-09-08 NOTE — Progress Notes (Signed)

## 2024-09-11 ENCOUNTER — Encounter: Payer: Self-pay | Admitting: Internal Medicine

## 2024-09-19 ENCOUNTER — Telehealth: Payer: Self-pay | Admitting: Internal Medicine

## 2024-09-19 NOTE — Telephone Encounter (Signed)
 Inbound call from patient stating she is scheduled to have a colonoscopy on Monday and has a cold with coughing. Patient is requesting a call back to discuss. Sending to POD due to [facility] being closed. Please advise.

## 2024-09-19 NOTE — Telephone Encounter (Signed)
 Pt states she developed a cold, cough, low grade temp 101 yesterday. Colon rescheduled to 10/17/24 at 10:30am. Pt aware of appt.

## 2024-09-22 ENCOUNTER — Encounter: Admitting: Internal Medicine

## 2024-10-17 ENCOUNTER — Ambulatory Visit: Admitting: Internal Medicine

## 2024-10-17 ENCOUNTER — Encounter: Payer: Self-pay | Admitting: Internal Medicine

## 2024-10-17 VITALS — BP 144/83 | HR 65 | Temp 97.2°F | Resp 12 | Ht 63.0 in | Wt 163.0 lb

## 2024-10-17 DIAGNOSIS — Z860101 Personal history of adenomatous and serrated colon polyps: Secondary | ICD-10-CM

## 2024-10-17 DIAGNOSIS — K5730 Diverticulosis of large intestine without perforation or abscess without bleeding: Secondary | ICD-10-CM

## 2024-10-17 DIAGNOSIS — Z860100 Personal history of colon polyps, unspecified: Secondary | ICD-10-CM

## 2024-10-17 DIAGNOSIS — Z1211 Encounter for screening for malignant neoplasm of colon: Secondary | ICD-10-CM

## 2024-10-17 DIAGNOSIS — D120 Benign neoplasm of cecum: Secondary | ICD-10-CM

## 2024-10-17 MED ORDER — SODIUM CHLORIDE 0.9 % IV SOLN: 500.0000 mL | INTRAVENOUS | Status: DC

## 2024-10-17 NOTE — Progress Notes (Signed)
 Called to room to assist during endoscopic procedure.  Patient ID and intended procedure confirmed with present staff. Received instructions for my participation in the procedure from the performing physician.

## 2024-10-17 NOTE — Progress Notes (Signed)
 Sedate, gd SR, tolerated procedure well, VSS, report to RN

## 2024-10-17 NOTE — Op Note (Signed)
 [facility] Patient Name: Amy Haynes Procedure Date: 10/17/2024 12:17 PM MRN: [id number] Endoscopist: Norleen SAILOR. Abran , MD, [id number] Age: 74 Referring MD:  Date of Birth: October 28, 1950 Gender: Female Account #: [id number] Procedure:                Colonoscopy with endoscopic mucosal resection x 1 Indications:              High risk colon cancer surveillance: Personal                            history of multiple (3 or more) adenomas. Previous                            examinations 2007, 2017, 2020 Medicines:                Monitored Anesthesia Care Procedure:                Pre-Anesthesia Assessment:                           - Prior to the procedure, a History and Physical                            was performed, and patient medications and                            allergies were reviewed. The patient's tolerance of                            previous anesthesia was also reviewed. The risks                            and benefits of the procedure and the sedation                            options and risks were discussed with the patient.                            All questions were answered, and informed consent                            was obtained. Prior Anticoagulants: The patient has                            taken no anticoagulant or antiplatelet agents. ASA                            Grade Assessment: II - A patient with mild systemic                            disease. After reviewing the risks and benefits,                            the patient was deemed in satisfactory condition to  undergo the procedure.                           After obtaining informed consent, the colonoscope                            was passed under direct vision. Throughout the                            procedure, the patient's blood pressure, pulse, and                            oxygen saturations were monitored continuously. The                             CF [id number] #[id number] was introduced through the anus                            and advanced to the the cecum, identified by                            appendiceal orifice and ileocecal valve. The                            ileocecal valve, appendiceal orifice, and rectum                            were photographed. The quality of the bowel                            preparation was excellent. The colonoscopy was                            performed without difficulty. The patient tolerated                            the procedure well. The bowel preparation used was                            SUPREP via split dose instruction. Scope In: 12:27:28 PM Scope Out: 12:45:35 PM Scope Withdrawal Time: 0 hours 14 minutes 24 seconds  Total Procedure Duration: 0 hours 18 minutes 7 seconds  Findings:                 A 15 mm polyp was found in the cecum. The polyp was                            semi-sessile. The polyp was removed via endoscopic                            mucosal resection technique (with a saline                            injection-lift technique using a hot snare).  Resection and retrieval were complete.                           A few diverticula were found in the right colon.                           The exam was otherwise without abnormality on                            direct and retroflexion views. Complications:            No immediate complications. Estimated blood loss:                            None. Estimated Blood Loss:     Estimated blood loss: none. Impression:               - One 15 mm polyp in the cecum, removed using                            injection-lift and a hot snare (EMR technique).                            Resected and retrieved.                           - Diverticulosis in the right colon.                           - The examination was otherwise normal on direct                            and retroflexion  views. Recommendation:           - Repeat colonoscopy in 3 years for surveillance.                           - Patient has a contact number available for                            emergencies. The signs and symptoms of potential                            delayed complications were discussed with the                            patient. Return to normal activities tomorrow.                            Written discharge instructions were provided to the                            patient.                           - Resume previous diet.                           -  Continue present medications.                           - Await pathology results. Norleen SAILOR. Abran, MD 10/17/2024 12:55:57 PM This report has been signed electronically.

## 2024-10-17 NOTE — Patient Instructions (Addendum)
 Resume previous diet Continue present medications Await pathology results See handouts for polyps and diverticulosis YOU HAD AN ENDOSCOPIC PROCEDURE TODAY AT THE [facility]:   Refer to the procedure report that was given to you for any specific questions about what was found during the examination.  If the procedure report does not answer your questions, please call your gastroenterologist to clarify.  If you requested that your care partner not be given the details of your procedure findings, then the procedure report has been included in a sealed envelope for you to review at your convenience later.  YOU SHOULD EXPECT: Some feelings of bloating in the abdomen. Passage of more gas than usual.  Walking can help get rid of the air that was put into your GI tract during the procedure and reduce the bloating. If you had a lower endoscopy (such as a colonoscopy or flexible sigmoidoscopy) you may notice spotting of blood in your stool or on the toilet paper. If you underwent a bowel prep for your procedure, you may not have a normal bowel movement for a few days.  Please Note:  You might notice some irritation and congestion in your nose or some drainage.  This is from the oxygen used during your procedure.  There is no need for concern and it should clear up in a day or so.  SYMPTOMS TO REPORT IMMEDIATELY:  Following lower endoscopy (colonoscopy or flexible sigmoidoscopy):  Excessive amounts of blood in the stool  Significant tenderness or worsening of abdominal pains  Swelling of the abdomen that is new, acute  Fever of 100F or higher  For urgent or emergent issues, a gastroenterologist can be reached at any hour by calling [phone]. Do not use MyChart messaging for urgent concerns.   DIET:  We do recommend a small meal at first, but then you may proceed to your regular diet.  Drink plenty of fluids but you should avoid alcoholic beverages for 24 hours.  ACTIVITY:  You should  plan to take it easy for the rest of today and you should NOT DRIVE or use heavy machinery until tomorrow (because of the sedation medicines used during the test).    FOLLOW UP: Our staff will call the number listed on your records the next business day following your procedure.  We will call around 7:15- 8:00 am to check on you and address any questions or concerns that you may have regarding the information given to you following your procedure. If we do not reach you, we will leave a message.     If any biopsies were taken you will be contacted by phone or by letter within the next 1-3 weeks.  Please call us  at [phone] if you have not heard about the biopsies in 3 weeks.   SIGNATURES/CONFIDENTIALITY: You and/or your care partner have signed paperwork which will be entered into your electronic medical record.  These signatures attest to the fact that that the information above on your After Visit Summary has been reviewed and is understood.  Full responsibility of the confidentiality of this discharge information lies with you and/or your care-partner.

## 2024-10-17 NOTE — Progress Notes (Signed)
 HISTORY OF PRESENT ILLNESS:  Amy Haynes is a 74 y.o. female with a history of multiple adenomatous colon polyps.  Presents today for surveillance colonoscopy.  No complaints  REVIEW OF SYSTEMS:  All non-GI ROS negative except for  Past Medical History:  Diagnosis Date   Anxiety    Arthritis    knees   Chronic kidney disease [id number]   Cancer/partial left kidney removed   Diverticulosis of colon    GERD (gastroesophageal reflux disease)    History of bronchitis    Hypercholesteremia    Hypertension    Microcytosis    Osteoporosis    Triggering of finger    Rt 3rd finger    Past Surgical History:  Procedure Laterality Date   CESAREAN SECTION     x2   COLONOSCOPY  2020   with polypectomy   CYST EXCISION Left 2022   face   FOOT SURGERY Left 1991   MULTIPLE TOOTH EXTRACTIONS     ROBOTIC ASSITED PARTIAL NEPHRECTOMY Left 09/13/2022   Procedure: XI ROBOTIC ASSISTED PARTIAL NEPHRECTOMY;  Surgeon: Devere Lonni Righter, MD;  Location: [facility];  Service: Urology;  Laterality: Left;  4 HRS   TUBAL LIGATION  1989    Social History Amy Haynes  reports that she has never smoked. She has never used smokeless tobacco. She reports that she does not currently use alcohol. She reports that she does not use drugs.  family history includes Arthritis in her mother; Breast cancer in her mother; Cancer (age of onset: 42) in her mother; Diabetes in her brother, sister, and sister; Heart disease in her sister and sister; Hypertension in her mother; Kidney disease in her sister and sister; Varicose Veins in her mother.  Allergies[1]     PHYSICAL EXAMINATION: Vital signs: BP (!) 143/79   Pulse (!) 53   Temp (!) 97.2 F (36.2 C) (Temporal)   Ht 5' 3 (1.6 m)   Wt 163 lb (73.9 kg)   SpO2 100%   BMI 28.87 kg/m  General: Well-developed, well-nourished, no acute distress HEENT: Sclerae are anicteric, conjunctiva pink. Oral mucosa intact Lungs: Clear Heart: Regular Abdomen:  soft, nontender, nondistended, no obvious ascites, no peritoneal signs, normal bowel sounds. No organomegaly. Extremities: No edema Psychiatric: alert and oriented x3. Cooperative     ASSESSMENT:  History of multiple adenomatous polyps   PLAN:  Surveillance colonoscopy         [1]  Allergies Allergen Reactions   Tramadol Nausea And Vomiting    Dizziness, drowsiness    Lactose Intolerance (Gi) Other (See Comments)    GI discomfort

## 2024-10-21 ENCOUNTER — Telehealth: Payer: Self-pay

## 2024-10-21 NOTE — Telephone Encounter (Signed)
" °  Follow up Call-     10/17/2024   10:55 AM  Call back number  Post procedure Call Back phone  # [phone]  Permission to leave phone message Yes     Patient questions:  Do you have a fever, pain , or abdominal swelling? No. Pain Score  0 *  Have you tolerated food without any problems? Yes.    Have you been able to return to your normal activities? Yes.    Do you have any questions about your discharge instructions: Diet   No. Medications  No. Follow up visit  No.  Do you have questions or concerns about your Care? No.  Actions: * If pain score is 4 or above: No action needed, pain <4.   "

## 2024-10-22 ENCOUNTER — Ambulatory Visit: Payer: Self-pay | Admitting: Internal Medicine

## 2024-10-22 LAB — SURGICAL PATHOLOGY
